# Patient Record
Sex: Female | Born: 2005 | Race: White | Hispanic: No | Marital: Single | Smoking: Never smoker
Health system: Southern US, Community
[De-identification: ages and names within clinical notes are randomized; demographics above are authoritative.]

## PROBLEM LIST (undated history)

## (undated) DIAGNOSIS — F319 Bipolar disorder, unspecified: Secondary | ICD-10-CM

## (undated) DIAGNOSIS — F32A Depression, unspecified: Secondary | ICD-10-CM

## (undated) DIAGNOSIS — F419 Anxiety disorder, unspecified: Secondary | ICD-10-CM

## (undated) HISTORY — PX: APPENDECTOMY: SHX54

## (undated) HISTORY — DX: Anxiety disorder, unspecified: F41.9

## (undated) HISTORY — DX: Depression, unspecified: F32.A

## (undated) HISTORY — DX: Bipolar disorder, unspecified: F31.9

---
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
## 2023-04-09 ENCOUNTER — Other Ambulatory Visit: Payer: Self-pay

## 2023-04-09 NOTE — Telephone Encounter (Signed)
Pt called in stating that she's will be a new pt as of next week Monday, and she was wondering if provider can refill her med for now?? escitalopram (LEXAPRO) 20 MG tablet,??   I told pt, that provider needs to meet her before, just in case it she does not get her refill.

## 2023-04-10 MED ORDER — ESCITALOPRAM OXALATE 20 MG PO TABS: 20.0000 mg | ORAL_TABLET | Freq: Every day | ORAL | 0 refills | Status: DC

## 2023-04-10 NOTE — Telephone Encounter (Signed)
It has been refilled.  

## 2023-04-10 NOTE — Telephone Encounter (Signed)
Charan are you willing to refill for 1 month?  Pt's appointment is 04/15/23.  Prescription is pended. Let me know and I can call the pt.

## 2023-04-14 NOTE — Progress Notes (Deleted)
   New Patient Office Visit  Subjective    Patient ID: Donna Wagner, female    DOB: [birth year]  Age: 17 y.o. MRN: [id number]  CC: No chief complaint on file.   HPI Donna Wagner presents to establish care. Her previous PCP was... She has history of anxiety,  ***  Health Maintenance  Topic Date Due   COVID-19 Vaccine (1) Never done   HPV VACCINES (1 - 2-dose series) Never done   Hepatitis C Screening  Never done   DTaP/Tdap/Td (1 - Tdap) Never done   INFLUENZA VACCINE  05/22/2023   HIV Screening  Completed       Topic Date Due   HPV VACCINES (1 - 2-dose series) Never done    Outpatient Encounter Medications as of 04/15/2023  Medication Sig   escitalopram (LEXAPRO) 20 MG tablet Take 1 tablet (20 mg total) by mouth daily.   HYDROcodone-acetaminophen (NORCO/VICODIN) 5-325 MG tablet Take 1-2 tablets by mouth every 4 (four) hours as needed for moderate pain.   No facility-administered encounter medications on file as of 04/15/2023.    No past medical history on file.  Past Surgical History:  Procedure Laterality Date   XI ROBOTIC LAPAROSCOPIC ASSISTED APPENDECTOMY N/A 11/19/2022   Procedure: XI ROBOTIC LAPAROSCOPIC ASSISTED APPENDECTOMY;  Surgeon: Carolan Shiver, MD;  Location: [facility] ORS;  Service: General;  Laterality: N/A;    No family history on file.  Social History   Socioeconomic History   Marital status: Single    Spouse name: Not on file   Number of children: Not on file   Years of education: Not on file   Highest education level: Not on file  Occupational History   Not on file  Tobacco Use   Smoking status: Never   Smokeless tobacco: Never  Vaping Use   Vaping Use: Never used  Substance and Sexual Activity   Alcohol use: Not Currently   Drug use: Not Currently   Sexual activity: Not Currently  Other Topics Concern   Not on file  Social History Narrative   Not on file   Social Determinants of Health   Financial Resource Strain: Not  on file  Food Insecurity: No Food Insecurity (11/19/2022)   Hunger Vital Sign    Worried About Running Out of Food in the Last Year: Never true    Ran Out of Food in the Last Year: Never true  Transportation Needs: No Transportation Needs (11/19/2022)   PRAPARE - Administrator, Civil Service (Medical): No    Lack of Transportation (Non-Medical): No  Physical Activity: Not on file  Stress: Not on file  Social Connections: Not on file  Intimate Partner Violence: Not At Risk (11/19/2022)   Humiliation, Afraid, Rape, and Kick questionnaire    Fear of Current or Ex-Partner: No    Emotionally Abused: No    Physically Abused: No    Sexually Abused: No    ROS Negative unless indicated in HPI.      Objective    There were no vitals taken for this visit.  Physical Exam  {Labs (Optional):23779}    Assessment & Plan:  There are no diagnoses linked to this encounter.  No follow-ups on file.   Kara Dies, NP

## 2023-04-15 ENCOUNTER — Ambulatory Visit: Payer: 59 | Admitting: Nurse Practitioner

## 2023-05-15 ENCOUNTER — Emergency Department
Admission: EM | Admit: 2023-05-15 | Discharge: 2023-05-15 | Disposition: A | Discharge status: Home / Self Care | Payer: 59 | Attending: Emergency Medicine | Admitting: Emergency Medicine

## 2023-05-15 ENCOUNTER — Encounter: Payer: Self-pay | Admitting: Nurse Practitioner

## 2023-05-15 ENCOUNTER — Ambulatory Visit (INDEPENDENT_AMBULATORY_CARE_PROVIDER_SITE_OTHER): Payer: 59 | Admitting: Nurse Practitioner

## 2023-05-15 ENCOUNTER — Other Ambulatory Visit: Payer: Self-pay

## 2023-05-15 VITALS — BP 118/76 | HR 91 | Temp 98.4°F | Ht 64.0 in | Wt 142.8 lb

## 2023-05-15 DIAGNOSIS — F515 Nightmare disorder: Secondary | ICD-10-CM | POA: Insufficient documentation

## 2023-05-15 DIAGNOSIS — F32A Depression, unspecified: Secondary | ICD-10-CM | POA: Insufficient documentation

## 2023-05-15 DIAGNOSIS — F329 Major depressive disorder, single episode, unspecified: Secondary | ICD-10-CM | POA: Diagnosis not present

## 2023-05-15 DIAGNOSIS — R45851 Suicidal ideations: Secondary | ICD-10-CM | POA: Insufficient documentation

## 2023-05-15 DIAGNOSIS — F419 Anxiety disorder, unspecified: Secondary | ICD-10-CM | POA: Diagnosis not present

## 2023-05-15 DIAGNOSIS — R37 Sexual dysfunction, unspecified: Secondary | ICD-10-CM | POA: Diagnosis not present

## 2023-05-15 DIAGNOSIS — F322 Major depressive disorder, single episode, severe without psychotic features: Secondary | ICD-10-CM | POA: Diagnosis present

## 2023-05-15 DIAGNOSIS — F4310 Post-traumatic stress disorder, unspecified: Secondary | ICD-10-CM

## 2023-05-15 LAB — CBC
HCT: 37.2 % (ref 36.0–46.0)
Hemoglobin: 12.9 g/dL (ref 12.0–15.0)
MCH: 30.1 pg (ref 26.0–34.0)
MCHC: 34.7 g/dL (ref 30.0–36.0)
MCV: 86.9 fL (ref 80.0–100.0)
Platelets: 292 10*3/uL (ref 150–400)
RBC: 4.28 MIL/uL (ref 3.87–5.11)
RDW: 13.2 % (ref 11.5–15.5)
WBC: 7 10*3/uL (ref 4.0–10.5)
nRBC: 0 % (ref 0.0–0.2)

## 2023-05-15 LAB — ETHANOL: Alcohol, Ethyl (B): <10 mg/dL (ref <10)

## 2023-05-15 LAB — URINE DRUG SCREEN, QUALITATIVE (ARMC ONLY)
Amphetamines, Ur Screen: NOT DETECTED
Barbiturates, Ur Screen: NOT DETECTED
Benzodiazepine, Ur Scrn: NOT DETECTED
Cannabinoid 50 Ng, Ur ~~LOC~~: POSITIVE — AB
Cocaine Metabolite,Ur ~~LOC~~: NOT DETECTED
MDMA (Ecstasy)Ur Screen: NOT DETECTED
Methadone Scn, Ur: NOT DETECTED
Opiate, Ur Screen: NOT DETECTED
Phencyclidine (PCP) Ur S: NOT DETECTED
Tricyclic, Ur Screen: NOT DETECTED

## 2023-05-15 LAB — COMPREHENSIVE METABOLIC PANEL
ALT: 20 U/L (ref 0–44)
AST: 28 U/L (ref 15–41)
Albumin: 4.4 g/dL (ref 3.5–5.0)
Alkaline Phosphatase: 89 U/L (ref 38–126)
Anion gap: 11 (ref 5–15)
BUN: 15 mg/dL (ref 6–20)
CO2: 22 mmol/L (ref 22–32)
Calcium: 9.3 mg/dL (ref 8.9–10.3)
Chloride: 102 mmol/L (ref 98–111)
Creatinine, Ser: 0.6 mg/dL (ref 0.44–1.00)
GFR, Estimated: >60 mL/min (ref >60)
Glucose, Bld: 79 mg/dL (ref 70–99)
Potassium: 3.7 mmol/L (ref 3.5–5.1)
Sodium: 135 mmol/L (ref 135–145)
Total Bilirubin: 0.7 mg/dL (ref 0.3–1.2)
Total Protein: 8.6 g/dL — ABNORMAL HIGH (ref 6.5–8.1)

## 2023-05-15 LAB — POC URINE PREG, ED: Preg Test, Ur: NEGATIVE

## 2023-05-15 LAB — ACETAMINOPHEN LEVEL: Acetaminophen (Tylenol), Serum: <10 ug/mL — ABNORMAL LOW (ref 10–30)

## 2023-05-15 LAB — SALICYLATE LEVEL: Salicylate Lvl: <7 mg/dL — ABNORMAL LOW (ref 7.0–30.0)

## 2023-05-15 MED ORDER — HYDROXYZINE PAMOATE 25 MG PO CAPS: 25.0000 mg | ORAL_CAPSULE | Freq: Three times a day (TID) | ORAL | 0 refills | Status: DC | PRN

## 2023-05-15 MED ORDER — IBUPROFEN 600 MG PO TABS: 600.0000 mg | ORAL_TABLET | Freq: Three times a day (TID) | ORAL | Status: DC | PRN

## 2023-05-15 MED ORDER — HYDROXYZINE HCL 25 MG PO TABS: 25.0000 mg | ORAL_TABLET | Freq: Three times a day (TID) | ORAL | 0 refills | Status: AC | PRN

## 2023-05-15 MED ORDER — ESCITALOPRAM OXALATE 20 MG PO TABS: 20.0000 mg | ORAL_TABLET | Freq: Every day | ORAL | 0 refills | Status: DC

## 2023-05-15 MED ORDER — ONDANSETRON HCL 4 MG PO TABS: 4.0000 mg | ORAL_TABLET | Freq: Three times a day (TID) | ORAL | Status: DC | PRN

## 2023-05-15 MED ORDER — ALUM & MAG HYDROXIDE-SIMETH 200-200-20 MG/5ML PO SUSP: 30.0000 mL | Freq: Four times a day (QID) | ORAL | Status: DC | PRN

## 2023-05-15 NOTE — ED Notes (Signed)
VOLUNTARY CONSULT DONE PENDING DISPOSITION

## 2023-05-15 NOTE — Patient Instructions (Addendum)
Please schedule fasting labs. Rx sent to pharmacy. Please let us know the where you would like to referral for mental health via mychart. Referral sent to Gyn.

## 2023-05-15 NOTE — ED Notes (Signed)
Pt has used phone to contact ride and bring her belongings. States that family is on the way
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
## 2023-05-15 NOTE — ED Notes (Signed)
Pt dressed out into hospital scrubs by this RN and Byrd Hesselbach NT.  Belongings sent home with mom Manson Passey bra Gray socks Purple crocs 2 bracelets Black earrings Black pants Black shirt Green jacket Gray underwear
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
## 2023-05-15 NOTE — Hospital Course (Signed)
During this hospital stay Donna Wagner will receive psychosocial and education assessment.  A treatment plan developed to decrease risk of relapse upon discharge and the need for readmission.  Donna Wagner will participate in group therapy.  Psychotherapy:  Psychosocial education regarding relapse prevention and self care; Social and Doctor, hospital; Learning based strategies; Cognitive behavioral; and family object relations individuation separation intervention psychotherapies can be considered.
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
## 2023-05-15 NOTE — ED Notes (Addendum)
Pt was being registered, nurse received a phone call by pts mother via caller ID. This nurse asked pt if nurse could speak and communicate with moter. Pt stated it was okay to do so, nurse answered the phone and caller had hung up when call was answered. Per triage staff pt belongings were not brought. DC paperwork reviewed with pt, questions answered. Pt advised this nurse that her mother may not be happy with plan, nurse offered to speak with her mother in triage at pt approval, pt agreed. Nurse escorted pt to triage where mother was standing. Nurse asked if pt mother had any questions and she replied no and that a complaint would be filed and walked to . Pt turned to nurse and nurse told pt that it was nice to meet her and nurse hopes she has a good night. Pt exited behind mother.  Triage staff had sent update to this nurse while nurse was with pt and update nurse when in triage. Pt mother stated in triage that she was unhappy that there was a teleconsult and that she was not notified of this. States she would not have brought her here if she was aware of this and planned to sue the hospital.
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
## 2023-05-15 NOTE — BH Assessment (Addendum)
TTS/Psych consults complete. Per psych NP Lodema Pilot D., patient does not meet inpatient criteria and is psych cleared. Pt was able to contract for safety and agreed to come back to the ED if symptoms worsen or SI returns.   This Clinical research associate provided pt with outpatient resources.

## 2023-05-15 NOTE — ED Triage Notes (Addendum)
Pt to ED voluntarily with mom for psych eval. Pt not wanting to talk much in triage. Per mom, pt has been having manic episodes and is having a "mental crisis". Earlier today she told her bf she wanted to take some pills that were at her bedside and told her mom that there was "nothing to live for". Pt denies SI but says she feels like she is going to have "violent outbursts" and feels like hurting other people. Pt recently had nightmares from a past event that she thinks has triggered these episodes. Hx of depression and anxiety

## 2023-05-15 NOTE — ED Notes (Signed)
Psych and tts to room for consult/teleconsult

## 2023-05-22 ENCOUNTER — Other Ambulatory Visit: Payer: Managed Care, Other (non HMO)

## 2023-05-22 DIAGNOSIS — F419 Anxiety disorder, unspecified: Secondary | ICD-10-CM | POA: Diagnosis not present

## 2023-05-22 DIAGNOSIS — F32A Depression, unspecified: Secondary | ICD-10-CM

## 2023-05-22 LAB — CBC WITH DIFFERENTIAL/PLATELET
Basophils Absolute: 0.1 10*3/uL (ref 0.0–0.1)
Basophils Relative: 1 % (ref 0.0–3.0)
Eosinophils Absolute: 0.3 10*3/uL (ref 0.0–0.7)
Eosinophils Relative: 4.9 % (ref 0.0–5.0)
HCT: 39.3 % (ref 36.0–49.0)
Hemoglobin: 13 g/dL (ref 12.0–16.0)
Lymphocytes Relative: 45 % (ref 24.0–48.0)
Lymphs Abs: 2.5 10*3/uL (ref 0.7–4.0)
MCHC: 33.1 g/dL (ref 31.0–37.0)
MCV: 90.1 fl (ref 78.0–98.0)
Monocytes Absolute: 0.5 10*3/uL (ref 0.1–1.0)
Monocytes Relative: 9.6 % (ref 3.0–12.0)
Neutro Abs: 2.2 10*3/uL (ref 1.4–7.7)
Neutrophils Relative %: 39.5 % — ABNORMAL LOW (ref 43.0–71.0)
Platelets: 292 10*3/uL (ref 150.0–575.0)
RBC: 4.37 Mil/uL (ref 3.80–5.70)
RDW: 13.7 % (ref 11.4–15.5)
WBC: 5.5 10*3/uL (ref 4.5–13.5)

## 2023-05-22 LAB — LIPID PANEL
Cholesterol: 142 mg/dL (ref 0–200)
HDL: 45.3 mg/dL (ref >39.00)
LDL Cholesterol: 88 mg/dL (ref 0–99)
NonHDL: 96.25
Total CHOL/HDL Ratio: 3
Triglycerides: 43 mg/dL (ref 0.0–149.0)
VLDL: 8.6 mg/dL (ref 0.0–40.0)

## 2023-05-22 LAB — COMPREHENSIVE METABOLIC PANEL
ALT: 12 U/L (ref 0–35)
AST: 17 U/L (ref 0–37)
Albumin: 4.2 g/dL (ref 3.5–5.2)
Alkaline Phosphatase: 87 U/L (ref 47–119)
BUN: 15 mg/dL (ref 6–23)
CO2: 28 mEq/L (ref 19–32)
Calcium: 9.3 mg/dL (ref 8.4–10.5)
Chloride: 103 mEq/L (ref 96–112)
Creatinine, Ser: 0.66 mg/dL (ref 0.40–1.20)
GFR: 127.2 mL/min (ref >60.00)
Glucose, Bld: 79 mg/dL (ref 70–99)
Potassium: 3.9 mEq/L (ref 3.5–5.1)
Sodium: 139 mEq/L (ref 135–145)
Total Bilirubin: 0.5 mg/dL (ref 0.2–1.2)
Total Protein: 7.7 g/dL (ref 6.0–8.3)

## 2023-05-22 LAB — TSH: TSH: 1.9 u[IU]/mL (ref 0.40–5.00)

## 2023-05-27 ENCOUNTER — Encounter: Payer: Self-pay | Admitting: Obstetrics and Gynecology

## 2023-05-28 ENCOUNTER — Other Ambulatory Visit: Payer: Self-pay | Admitting: Nurse Practitioner

## 2023-05-28 DIAGNOSIS — F32A Anxiety disorder, unspecified: Secondary | ICD-10-CM

## 2023-05-29 DIAGNOSIS — R37 Sexual dysfunction, unspecified: Secondary | ICD-10-CM | POA: Insufficient documentation

## 2023-05-29 DIAGNOSIS — F32A Depression, unspecified: Secondary | ICD-10-CM | POA: Insufficient documentation

## 2023-05-29 NOTE — Assessment & Plan Note (Addendum)
PHQ-9 score 14 and GAD -7 score 11. Continue Zoloft and will treat with hydroxyzine as needed for anxiety. Will refer to the therapist, list of therapist was provided to the patient. Fasting labs ordered.

## 2023-05-29 NOTE — Assessment & Plan Note (Signed)
Referral sent to GYN

## 2023-05-30 ENCOUNTER — Telehealth: Payer: Self-pay | Admitting: Nurse Practitioner

## 2023-05-30 ENCOUNTER — Other Ambulatory Visit: Payer: Self-pay

## 2023-05-30 DIAGNOSIS — F32A Depression, unspecified: Secondary | ICD-10-CM

## 2023-05-30 MED ORDER — ESCITALOPRAM OXALATE 20 MG PO TABS
20.0000 mg | ORAL_TABLET | Freq: Every day | ORAL | 0 refills | Status: DC
Start: 2023-05-30 — End: 2023-06-25

## 2023-05-30 NOTE — Telephone Encounter (Signed)
Prescription Request  05/30/2023  LOV: 05/15/2023  What is the name of the medication or equipment? escitalopram (LEXAPRO) 20 MG tablet  Have you contacted your pharmacy to request a refill? Yes   Which pharmacy would you like this sent to?   [id number] [name], [place] - [address] AT [facility] [name] [place] [address] Phone: [phone] Fax: [phone]    Patient notified that their request is being sent to the clinical staff for review and that they should receive a response within 2 business days.   Please advise at Mobile [phone] (mobile)

## 2023-06-23 ENCOUNTER — Other Ambulatory Visit: Payer: Self-pay | Admitting: Nurse Practitioner

## 2023-06-23 DIAGNOSIS — F419 Anxiety disorder, unspecified: Secondary | ICD-10-CM

## 2023-06-23 DIAGNOSIS — F32A Depression, unspecified: Secondary | ICD-10-CM

## 2023-08-03 ENCOUNTER — Emergency Department
Admission: EM | Admit: 2023-08-03 | Discharge: 2023-08-04 | Disposition: A | Discharge status: Other Acute Inpatient Hospital | Payer: 59 | Attending: Emergency Medicine | Admitting: Emergency Medicine

## 2023-08-03 ENCOUNTER — Other Ambulatory Visit: Payer: Self-pay

## 2023-08-03 DIAGNOSIS — F339 Major depressive disorder, recurrent, unspecified: Secondary | ICD-10-CM | POA: Insufficient documentation

## 2023-08-03 DIAGNOSIS — Z79899 Other long term (current) drug therapy: Secondary | ICD-10-CM | POA: Insufficient documentation

## 2023-08-03 DIAGNOSIS — F4310 Post-traumatic stress disorder, unspecified: Secondary | ICD-10-CM | POA: Diagnosis not present

## 2023-08-03 DIAGNOSIS — F29 Unspecified psychosis not due to a substance or known physiological condition: Secondary | ICD-10-CM | POA: Insufficient documentation

## 2023-08-03 DIAGNOSIS — F419 Anxiety disorder, unspecified: Secondary | ICD-10-CM | POA: Diagnosis present

## 2023-08-03 DIAGNOSIS — F32A Depression, unspecified: Secondary | ICD-10-CM | POA: Diagnosis present

## 2023-08-03 DIAGNOSIS — T50901A Poisoning by unspecified drugs, medicaments and biological substances, accidental (unintentional), initial encounter: Secondary | ICD-10-CM | POA: Diagnosis present

## 2023-08-03 DIAGNOSIS — R45851 Suicidal ideations: Secondary | ICD-10-CM | POA: Diagnosis not present

## 2023-08-03 LAB — COMPREHENSIVE METABOLIC PANEL
ALT: 12 U/L (ref 0–44)
AST: 17 U/L (ref 15–41)
Albumin: 4.1 g/dL (ref 3.5–5.0)
Alkaline Phosphatase: 78 U/L (ref 38–126)
Anion gap: 11 (ref 5–15)
BUN: 11 mg/dL (ref 6–20)
CO2: 23 mmol/L (ref 22–32)
Calcium: 9.2 mg/dL (ref 8.9–10.3)
Chloride: 103 mmol/L (ref 98–111)
Creatinine, Ser: 0.71 mg/dL (ref 0.44–1.00)
GFR, Estimated: >60 mL/min (ref >60)
Glucose, Bld: 86 mg/dL (ref 70–99)
Potassium: 3.3 mmol/L — ABNORMAL LOW (ref 3.5–5.1)
Sodium: 137 mmol/L (ref 135–145)
Total Bilirubin: 0.7 mg/dL (ref 0.3–1.2)
Total Protein: 7.5 g/dL (ref 6.5–8.1)

## 2023-08-03 LAB — ACETAMINOPHEN LEVEL: Acetaminophen (Tylenol), Serum: <10 ug/mL — ABNORMAL LOW (ref 10–30)

## 2023-08-03 LAB — CBC
HCT: 37.3 % (ref 36.0–46.0)
Hemoglobin: 12.9 g/dL (ref 12.0–15.0)
MCH: 30.7 pg (ref 26.0–34.0)
MCHC: 34.6 g/dL (ref 30.0–36.0)
MCV: 88.8 fL (ref 80.0–100.0)
Platelets: 272 10*3/uL (ref 150–400)
RBC: 4.2 MIL/uL (ref 3.87–5.11)
RDW: 13.3 % (ref 11.5–15.5)
WBC: 5.8 10*3/uL (ref 4.0–10.5)
nRBC: 0 % (ref 0.0–0.2)

## 2023-08-03 LAB — URINE DRUG SCREEN, QUALITATIVE (ARMC ONLY)
Amphetamines, Ur Screen: NOT DETECTED
Barbiturates, Ur Screen: NOT DETECTED
Benzodiazepine, Ur Scrn: NOT DETECTED
Cannabinoid 50 Ng, Ur ~~LOC~~: POSITIVE — AB
Cocaine Metabolite,Ur ~~LOC~~: NOT DETECTED
MDMA (Ecstasy)Ur Screen: NOT DETECTED
Methadone Scn, Ur: NOT DETECTED
Opiate, Ur Screen: NOT DETECTED
Phencyclidine (PCP) Ur S: NOT DETECTED
Tricyclic, Ur Screen: NOT DETECTED

## 2023-08-03 LAB — ETHANOL: Alcohol, Ethyl (B): <10 mg/dL (ref <10)

## 2023-08-03 LAB — PREGNANCY, URINE: Preg Test, Ur: NEGATIVE

## 2023-08-03 LAB — SALICYLATE LEVEL: Salicylate Lvl: <7 mg/dL — ABNORMAL LOW (ref 7.0–30.0)

## 2023-08-03 MED ORDER — POTASSIUM CHLORIDE CRYS ER 20 MEQ PO TBCR
20.0000 meq | EXTENDED_RELEASE_TABLET | Freq: Once | ORAL | Status: AC
  Administered 2023-08-03: 20 meq via ORAL
  Filled 2023-08-03: qty 1

## 2023-08-03 MED ORDER — ARIPIPRAZOLE 10 MG PO TABS
5.0000 mg | ORAL_TABLET | Freq: Every day | ORAL | Status: DC
  Administered 2023-08-03: 5 mg via ORAL
  Filled 2023-08-03: qty 1

## 2023-08-03 MED ORDER — MELATONIN 5 MG PO TABS: 5.0000 mg | ORAL_TABLET | Freq: Every day | ORAL | Status: DC

## 2023-08-03 MED ORDER — ESCITALOPRAM OXALATE 10 MG PO TABS: 20.0000 mg | ORAL_TABLET | Freq: Every day | ORAL | Status: DC

## 2023-08-03 MED ORDER — HYDROXYZINE HCL 25 MG PO TABS: 25.0000 mg | ORAL_TABLET | Freq: Three times a day (TID) | ORAL | Status: DC | PRN

## 2023-08-03 NOTE — ED Provider Notes (Signed)
[facility] Provider Note    Event Date/Time   First MD Initiated Contact with Patient 08/03/23 1459     (approximate)   History   Chief Complaint Mental Health Problem   HPI  Donna A Steinfeldt is a 17 y.o. female with past medical history of depression and PTSD who presents to the ED for mental health evaluation.  Patient reports that she has been having outbursts of anxiety and anger, where she wants to break things and hurt herself.  Boyfriend stated in triage that she will become very violent at times and nobody can talk her down.  Patient was initially screaming and kicking on arrival to the ED, but is now calm and answering questions appropriately.  She does report thoughts of harming herself, states she wants to punch through glass.  She states she has been taking her medications as prescribed, denies any substance abuse.     Physical Exam   Triage Vital Signs: ED Triage Vitals  Encounter Vitals Group     BP 08/03/23 1400 111/75     Systolic BP Percentile --      Diastolic BP Percentile --      Pulse Rate 08/03/23 1400 71     Resp 08/03/23 1400 16     Temp 08/03/23 1400 98 F (36.7 C)     Temp src --      SpO2 08/03/23 1400 95 %     Weight 08/03/23 1405 120 lb (54.4 kg)     Height 08/03/23 1405 5\' 4"  (1.626 m)     Head Circumference --      Peak Flow --      Pain Score --      Pain Loc --      Pain Education --      Exclude from Growth Chart --     Most recent vital signs: Vitals:   08/03/23 1400  BP: 111/75  Pulse: 71  Resp: 16  Temp: 98 F (36.7 C)  SpO2: 95%    Constitutional: Alert and oriented. Eyes: Conjunctivae are normal. Head: Atraumatic. Nose: No congestion/rhinnorhea. Mouth/Throat: Mucous membranes are moist.  Cardiovascular: Normal rate, regular rhythm. Grossly normal heart sounds.  2+ radial pulses bilaterally. Respiratory: Normal respiratory effort.  No retractions. Lungs CTAB. Gastrointestinal: Soft and  nontender. No distention. Musculoskeletal: No lower extremity tenderness nor edema.  Neurologic:  Normal speech and language. No gross focal neurologic deficits are appreciated.    ED Results / Procedures / Treatments   Labs (all labs ordered are listed, but only abnormal results are displayed) Labs Reviewed  COMPREHENSIVE METABOLIC PANEL - Abnormal; Notable for the following components:      Result Value   Potassium 3.3 (*)    All other components within normal limits  SALICYLATE LEVEL - Abnormal; Notable for the following components:   Salicylate Lvl <7.0 (*)    All other components within normal limits  ACETAMINOPHEN LEVEL - Abnormal; Notable for the following components:   Acetaminophen (Tylenol), Serum <10 (*)    All other components within normal limits  URINE DRUG SCREEN, QUALITATIVE ([facility] ONLY) - Abnormal; Notable for the following components:   Cannabinoid 50 Ng, Ur [place] POSITIVE (*)    All other components within normal limits  ETHANOL  CBC  PREGNANCY, URINE  POC URINE PREG, ED    PROCEDURES:  Critical Care performed: No  Procedures   MEDICATIONS ORDERED IN ED: Medications  potassium chloride SA (KLOR-CON M) CR tablet 20  mEq (20 mEq Oral Given 08/03/23 1537)     IMPRESSION / MDM / ASSESSMENT AND PLAN / ED COURSE  I reviewed the triage vital signs and the nursing notes.                              17 y.o. female with past medical history of depression and PTSD who presents to the ED for outburst of wanting to break things and harm herself.  Patient's presentation is most consistent with acute presentation with potential threat to life or bodily function.  Differential diagnosis includes, but is not limited to, psychosis, anxiety, depression, mania, substance abuse, medication noncompliance.  Patient nontoxic-appearing and in no acute distress, vital signs are unremarkable.  Patient denies any medical complaints and labs are reassuring with no significant  EMEA, leukocytosis, electrolyte abnormality, or AKI.  LFTs are unremarkable, Tylenol and salicylate levels are undetectable.  Patient may be medically cleared for psychiatric disposition, has been placed under IVC due to erratic behavior with suicidal ideation.  Psychiatric evaluation is pending at this time.  The patient has been placed in psychiatric observation due to the need to provide a safe environment for the patient while obtaining psychiatric consultation and evaluation, as well as ongoing medical and medication management to treat the patient's condition.  The patient has been placed under full IVC at this time.      FINAL CLINICAL IMPRESSION(S) / ED DIAGNOSES   Final diagnoses:  Suicidal ideation     Rx / DC Orders   ED Discharge Orders     None        Note:  This document was prepared using Dragon voice recognition software and may include unintentional dictation errors.   Chesley Noon, MD 08/03/23 [id number]

## 2023-08-03 NOTE — ED Notes (Signed)
PATIENT BED AVAILABLE AFTER 9AM ON 08/04/23 Patient has been accepted to [facility]  Patient assigned to room 402 bed 2 Accepting physician is Dr. Sherron Flemings.  Call report to [phone].  Representative was [facility] [place].

## 2023-08-03 NOTE — BH Assessment (Signed)
PATIENT BED AVAILABLE AFTER 9AM ON 08/04/23  Patient has been accepted to [facility]  Patient assigned to room 402 bed 2 Accepting physician is Dr. Sherron Flemings.  Call report to [phone].  Representative was [facility] [place].   ER Staff is aware of it:  [facility] ER Secretary  Dr. Larinda Buttery, ER MD  Feliz Beam Patient's Nurse
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
## 2023-08-03 NOTE — ED Triage Notes (Signed)
Pt sts that she has been having anxiety and outburst for months. Pt boyfriend sts that pt does not want to get help but pt become very violent and no one can talk her down. Pt is screaming in triage while kicking and moving arms.
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
## 2023-08-03 NOTE — BH Assessment (Signed)
[facility] AC Tresa Endo S.) updated about the patient's disposition for inpatient treatment.

## 2023-08-03 NOTE — ED Notes (Signed)
ivc/consult done/recommend psychiatric inpatient admission when medically cleared.Marland Kitchen

## 2023-08-03 NOTE — ED Notes (Addendum)
Pt belongings:  Lilac crocs Black shorts Peach sweater Pink underwear Brown bra Hair scunchie  Boyfriend to take belongings with him.

## 2023-08-03 NOTE — ED Triage Notes (Signed)
First Nurse Note;  Pt mother, father, and sister at first nurse desk wanting to see patient. Explained that pt did not want these family members with her and wanted her boyfriend with her. Pt's parent very concerned about her being discharged, explained that we would prioritize pt to come back to a room to see a doctor. Parents very concerned that she will kill herself and that she has be misdiagnosed with depression instead of bipolar disorder.

## 2023-08-04 ENCOUNTER — Inpatient Hospital Stay (HOSPITAL_COMMUNITY)
Admission: AD | Admit: 2023-08-04 | Discharge: 2023-08-11 | DRG: 885 | Disposition: A | Discharge status: Home / Self Care | Payer: 59 | Source: Intra-hospital | Attending: Psychiatry | Admitting: Psychiatry

## 2023-08-04 ENCOUNTER — Encounter (HOSPITAL_COMMUNITY): Payer: Self-pay | Admitting: Psychiatry

## 2023-08-04 DIAGNOSIS — R45851 Suicidal ideations: Secondary | ICD-10-CM | POA: Diagnosis present

## 2023-08-04 DIAGNOSIS — Z8249 Family history of ischemic heart disease and other diseases of the circulatory system: Secondary | ICD-10-CM | POA: Diagnosis not present

## 2023-08-04 DIAGNOSIS — F909 Attention-deficit hyperactivity disorder, unspecified type: Secondary | ICD-10-CM | POA: Diagnosis present

## 2023-08-04 DIAGNOSIS — F3181 Bipolar II disorder: Secondary | ICD-10-CM | POA: Diagnosis present

## 2023-08-04 DIAGNOSIS — Z79899 Other long term (current) drug therapy: Secondary | ICD-10-CM | POA: Diagnosis not present

## 2023-08-04 DIAGNOSIS — Z818 Family history of other mental and behavioral disorders: Secondary | ICD-10-CM | POA: Diagnosis not present

## 2023-08-04 DIAGNOSIS — F332 Major depressive disorder, recurrent severe without psychotic features: Secondary | ICD-10-CM | POA: Diagnosis not present

## 2023-08-04 DIAGNOSIS — Z888 Allergy status to other drugs, medicaments and biological substances status: Secondary | ICD-10-CM

## 2023-08-04 DIAGNOSIS — Z809 Family history of malignant neoplasm, unspecified: Secondary | ICD-10-CM | POA: Diagnosis not present

## 2023-08-04 DIAGNOSIS — F4310 Post-traumatic stress disorder, unspecified: Secondary | ICD-10-CM | POA: Diagnosis present

## 2023-08-04 DIAGNOSIS — F17290 Nicotine dependence, other tobacco product, uncomplicated: Secondary | ICD-10-CM | POA: Diagnosis present

## 2023-08-04 MED ORDER — DIVALPROEX SODIUM ER 250 MG PO TB24
250.0000 mg | ORAL_TABLET | Freq: Every day | ORAL | Status: DC
  Administered 2023-08-04: 250 mg via ORAL
  Filled 2023-08-04 (×2): qty 1

## 2023-08-04 MED ORDER — MELATONIN 5 MG PO TABS
5.0000 mg | ORAL_TABLET | Freq: Every evening | ORAL | Status: DC | PRN
  Administered 2023-08-04 – 2023-08-10 (×7): 5 mg via ORAL
  Filled 2023-08-04 (×6): qty 1

## 2023-08-04 MED ORDER — ALUM & MAG HYDROXIDE-SIMETH 200-200-20 MG/5ML PO SUSP: 30.0000 mL | ORAL | Status: DC | PRN

## 2023-08-04 MED ORDER — LORAZEPAM 2 MG/ML IJ SOLN: 2.0000 mg | Freq: Three times a day (TID) | INTRAMUSCULAR | Status: DC | PRN

## 2023-08-04 MED ORDER — LORAZEPAM 1 MG PO TABS: 2.0000 mg | ORAL_TABLET | Freq: Three times a day (TID) | ORAL | Status: DC | PRN

## 2023-08-04 MED ORDER — DIPHENHYDRAMINE HCL 50 MG/ML IJ SOLN: 50.0000 mg | Freq: Three times a day (TID) | INTRAMUSCULAR | Status: DC | PRN

## 2023-08-04 MED ORDER — HALOPERIDOL LACTATE 5 MG/ML IJ SOLN: 5.0000 mg | Freq: Three times a day (TID) | INTRAMUSCULAR | Status: DC | PRN

## 2023-08-04 MED ORDER — HALOPERIDOL 5 MG PO TABS: 5.0000 mg | ORAL_TABLET | Freq: Three times a day (TID) | ORAL | Status: DC | PRN

## 2023-08-04 MED ORDER — DIPHENHYDRAMINE HCL 25 MG PO CAPS: 50.0000 mg | ORAL_CAPSULE | Freq: Three times a day (TID) | ORAL | Status: DC | PRN

## 2023-08-04 MED ORDER — ACETAMINOPHEN 325 MG PO TABS: 650.0000 mg | ORAL_TABLET | Freq: Four times a day (QID) | ORAL | Status: DC | PRN

## 2023-08-04 MED ORDER — ARIPIPRAZOLE 5 MG PO TABS
5.0000 mg | ORAL_TABLET | Freq: Every day | ORAL | Status: DC
  Administered 2023-08-04 – 2023-08-11 (×8): 5 mg via ORAL
  Filled 2023-08-04 (×12): qty 1

## 2023-08-04 MED ORDER — HYDROXYZINE HCL 25 MG PO TABS
25.0000 mg | ORAL_TABLET | Freq: Three times a day (TID) | ORAL | Status: DC | PRN
  Administered 2023-08-06 – 2023-08-10 (×3): 25 mg via ORAL
  Filled 2023-08-04 (×5): qty 1

## 2023-08-04 MED ORDER — MELATONIN 5 MG PO TABS
5.0000 mg | ORAL_TABLET | Freq: Every day | ORAL | Status: DC
  Filled 2023-08-04 (×2): qty 1

## 2023-08-04 MED ORDER — ESCITALOPRAM OXALATE 20 MG PO TABS
20.0000 mg | ORAL_TABLET | Freq: Every day | ORAL | Status: DC
  Administered 2023-08-04 – 2023-08-11 (×8): 20 mg via ORAL
  Filled 2023-08-04: qty 1
  Filled 2023-08-04: qty 2
  Filled 2023-08-04 (×9): qty 1
  Filled 2023-08-04: qty 2

## 2023-08-04 MED ORDER — MAGNESIUM HYDROXIDE 400 MG/5ML PO SUSP: 30.0000 mL | Freq: Every day | ORAL | Status: DC | PRN

## 2023-08-04 NOTE — BHH Suicide Risk Assessment (Signed)
[facility] Admission Suicide Risk Assessment   Nursing information obtained from:    Demographic factors:  Low socioeconomic status, Caucasian, Adolescent or young adult Current Mental Status:  Self-harm behaviors Loss Factors:  NA Historical Factors:  Prior suicide attempts, Impulsivity Risk Reduction Factors:  Living with another person, especially a relative  Total Time spent with patient: 30 minutes Principal Problem: MDD (major depressive disorder), recurrent severe, without psychosis (HCC) Diagnosis:  Principal Problem:   MDD (major depressive disorder), recurrent severe, without psychosis (HCC)  Subjective Data: The patient is a 17 year old Caucasian female who was admitted with symptoms of violent outbursts and self-mutilation.  Continued Clinical Symptoms:  Alcohol Use Disorder Identification Test Final Score (AUDIT): 0 The "Alcohol Use Disorders Identification Test", Guidelines for Use in Primary Care, Second Edition.  World Science writer [facility]). Score between 0-7:  no or low risk or alcohol related problems. Score between 8-15:  moderate risk of alcohol related problems. Score between 16-19:  high risk of alcohol related problems. Score 20 or above:  warrants further diagnostic evaluation for alcohol dependence and treatment.   CLINICAL FACTORS:   Severe Anxiety and/or Agitation Depression:   Impulsivity Alcohol/Substance Abuse/Dependencies Personality Disorders:   Cluster B More than one psychiatric diagnosis   Musculoskeletal: Strength & Muscle Tone: within normal limits Gait & Station: normal Patient leans: N/A  Psychiatric Specialty Exam:  Presentation  General Appearance:  Fairly Groomed  Eye Contact: Good  Speech: Clear and Coherent; Normal Rate  Speech Volume: Normal  Handedness: Right   Mood and Affect  Mood: Anxious; Depressed  Affect: Blunt; Congruent   Thought Process  Thought Processes: Goal Directed; Linear  Descriptions of  Associations:Intact  Orientation:Full (Time, Place and Person)  Thought Content:Illogical  History of Schizophrenia/Schizoaffective disorder:No  Duration of Psychotic Symptoms:No data recorded Hallucinations:Hallucinations: None  Ideas of Reference:None  Suicidal Thoughts:Suicidal Thoughts: Yes, Passive (pt minimizes)  Homicidal Thoughts:Homicidal Thoughts: No   Sensorium  Memory: Immediate Good; Recent Good; Remote Good  Judgment: Poor  Insight: Lacking   Executive Functions  Concentration: Fair  Attention Span: Fair  Recall: Good  Fund of Knowledge: Fair  Language: Good   Psychomotor Activity  Psychomotor Activity: Psychomotor Activity: Increased (pt observed fidgeting in the chair.)   Assets  Assets: Communication Skills; Financial Resources/Insurance; Housing; Social Support   Sleep  Sleep: Sleep: Fair Number of Hours of Sleep: 6    Physical Exam: Physical Exam Constitutional:      Appearance: Normal appearance. She is normal weight.  HENT:     Head: Normocephalic.  Neurological:     General: No focal deficit present.     Mental Status: She is alert and oriented to person, place, and time. Mental status is at baseline.    Review of Systems  Psychiatric/Behavioral:  Negative for substance abuse and suicidal ideas. The patient is not nervous/anxious.   All other systems reviewed and are negative.  Blood pressure 118/82, pulse 76, temperature 98.4 F (36.9 C), temperature source Oral, resp. rate 18, height 5\' 4"  (1.626 m), weight 54.6 kg, last menstrual period 08/03/2023, SpO2 100%. Body mass index is 20.67 kg/m.   COGNITIVE FEATURES THAT CONTRIBUTE TO RISK:  Thought constriction (tunnel vision)    SUICIDE RISK:   Moderate:  Frequent suicidal ideation with limited intensity, and duration, some specificity in terms of plans, no associated intent, good self-control, limited dysphoria/symptomatology, some risk factors present, and  identifiable protective factors, including available and accessible social support.  PLAN OF CARE: The patient is  admitted to the adult inpatient unit for stabilization in a safe and secure environment.  She will undergo diagnostic clarification and will most likely be started on medications that include a mood stabilizer.  I certify that inpatient services furnished can reasonably be expected to improve the patient's condition.   Rex Kras, MD 08/04/2023, 12:23 PM

## 2023-08-04 NOTE — ED Provider Notes (Signed)
Emergency Medicine Observation Re-evaluation Note  Donna Wagner is a 17 y.o. female, seen on rounds today.  Pt initially presented to the ED for complaints of Mental Health Problem  Currently, the patient is is no acute distress. Denies any concerns at this time.  Physical Exam  Blood pressure 97/60, pulse 62, temperature 98.2 F (36.8 C), temperature source Oral, resp. rate 17, height 5\' 4"  (1.626 m), weight 54.4 kg, last menstrual period 08/03/2023, SpO2 100%.  Physical Exam: General: No apparent distress Pulm: Normal WOB Neuro: Moving all extremities Psych: Resting comfortably     ED Course / MDM     I have reviewed the labs performed to date as well as medications administered while in observation.  Recent changes in the last 24 hours include: No acute events overnight.  Plan   Current plan: Patient awaiting psychiatric disposition. Patient is under full IVC at this time.    Bernhard Koskinen, Layla Maw, DO 08/04/23 [phone]
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
## 2023-08-04 NOTE — Group Note (Signed)
Date:  08/04/2023 Time:  11:55 AM  Group Topic/Focus:  Goals Group:   The focus of this group is to help patients establish daily goals to achieve during treatment and discuss how the patient can incorporate goal setting into their daily lives to aide in recovery.    Participation Level:  Did Not Attend  Participation Quality:      Affect:      Cognitive:      Insight: None  Engagement in Group:      Modes of Intervention:      Additional Comments:    Beckie Busing 08/04/2023, 11:55 AM

## 2023-08-04 NOTE — Progress Notes (Signed)
   08/04/23 2345  Psych Admission Type (Psych Patients Only)  Admission Status Involuntary  Psychosocial Assessment  Patient Complaints Anxiety  Eye Contact Fair  Facial Expression Anxious  Affect Depressed  Speech Soft  Interaction Assertive  Motor Activity Slow  Appearance/Hygiene Improved  Behavior Characteristics Cooperative  Mood Depressed  Aggressive Behavior  Effect No apparent injury  Thought Process  Coherency WDL  Content WDL  Delusions WDL  Perception WDL  Hallucination None reported or observed  Judgment WDL  Confusion WDL  Danger to Self  Current suicidal ideation? Denies

## 2023-08-04 NOTE — ED Notes (Signed)
Attempted to call report to [facility], asked to call back in 20 minutes.

## 2023-08-04 NOTE — ED Notes (Signed)
Mother requested update on pt, pt gave permission to update mother. Mother to drop off pt glasses.

## 2023-08-04 NOTE — ED Provider Notes (Signed)
-----------------------------------------   10:12 AM on 08/04/2023 ----------------------------------------- Patient has been accepted to [facility] behavioral health unit for further treatment.   Minna Antis, MD 08/04/23 1012
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
## 2023-08-04 NOTE — Plan of Care (Signed)
Problem: Education: Goal: Emotional status will improve Outcome: Progressing Goal: Mental status will improve Outcome: Progressing   Problem: Activity: Goal: Interest or engagement in activities will improve Outcome: Progressing Goal: Sleeping patterns will improve Outcome: Progressing   Problem: Safety: Goal: Periods of time without injury will increase Outcome: Progressing

## 2023-08-04 NOTE — Group Note (Signed)
Date:  08/04/2023 Time:  12:01 PM  Group Topic/Focus:  Dimensions of Wellness:   The focus of this group is to introduce the topic of wellness and discuss the role each dimension of wellness plays in total health.    Participation Level:  Did Not Attend  Participation Quality:      Affect:      Cognitive:      Insight: None  Engagement in Group:      Modes of Intervention:      Additional Comments:    Beckie Busing 08/04/2023, 12:01 PM

## 2023-08-04 NOTE — ED Notes (Signed)
[place]  called  for  transport  to [facility] 

## 2023-08-04 NOTE — [facility] Group Notes (Signed)
[facility] Group Notes:  (Nursing/MHT/Case Management/Adjunct)  Date:  08/04/2023  Time:  9:52 PM  Type of Therapy:  Group Therapy  Participation Level:  Minimal  Participation Quality:  Attentive  Affect:  Appropriate  Cognitive:  Appropriate  Insight:  Limited  Engagement in Group:  Limited  Modes of Intervention:  Education  Summary of Progress/Problems: Patient attended the evening A.A. meeting and was appropriate.   Hazle Coca S 08/04/2023, 9:52 PM

## 2023-08-04 NOTE — ED Notes (Signed)
Belonging bag 1/1 sent with pt.

## 2023-08-04 NOTE — ED Notes (Signed)
Pt given breakfast tray

## 2023-08-05 DIAGNOSIS — F332 Major depressive disorder, recurrent severe without psychotic features: Secondary | ICD-10-CM | POA: Diagnosis not present

## 2023-08-05 MED ORDER — DIVALPROEX SODIUM ER 500 MG PO TB24
500.0000 mg | ORAL_TABLET | Freq: Every day | ORAL | Status: DC
  Administered 2023-08-05 – 2023-08-10 (×6): 500 mg via ORAL
  Filled 2023-08-05 (×8): qty 1

## 2023-08-05 NOTE — Group Note (Signed)
Date:  08/05/2023 Time:  9:20 AM  Group Topic/Focus:  Goals Group:   The focus of this group is to help patients establish daily goals to achieve during treatment and discuss how the patient can incorporate goal setting into their daily lives to aide in recovery.    Participation Level:  Active  Participation Quality:  Appropriate  Affect:  Appropriate  Cognitive:  Appropriate  Insight: Appropriate  Engagement in Group:  Engaged  Modes of Intervention:  Discussion  Additional Comments:    Donna Wagner 08/05/2023, 9:20 AM

## 2023-08-05 NOTE — [facility] Group Notes (Addendum)

## 2023-08-05 NOTE — [facility] Group Notes (Signed)
Adult Psychoeducational Group Note  Date:  08/05/2023 Time:  1:56 PM  Group Topic/Focus:  Crisis Planning:   The purpose of this group is to help patients create a crisis plan for use upon discharge or in the future, as needed.  Participation Level:  Active  Participation Quality:  Attentive  Affect:  Appropriate  Cognitive:  Appropriate  Insight: Appropriate  Engagement in Group:  Engaged  Modes of Intervention:  Discussion  Additional Comments:  Patient attended the [facility] group.  Jearl Klinefelter 08/05/2023, 1:56 PM

## 2023-08-05 NOTE — Plan of Care (Signed)

## 2023-08-05 NOTE — [facility] Group Notes (Signed)
[facility] Group Notes:  (Nursing/MHT/Case Management/Adjunct)  Date:  08/05/2023  Time:  9:41 PM  Type of Therapy:   Wrap-up group  Participation Level:  Active  Participation Quality:  Appropriate  Affect:  Appropriate  Cognitive:  Appropriate  Insight:  Appropriate  Engagement in Group:  Engaged  Modes of Intervention:  Education  Summary of Progress/Problems: Pt goal to feel her emotions, not sleep.Rated her day 6/10.  Noah Delaine 08/05/2023, 9:41 PM

## 2023-08-05 NOTE — Group Note (Signed)
Recreation Therapy Group Note   Group Topic:Animal Assisted Therapy   Group Date: 08/05/2023 Start Time: 0954 End Time: 1030 Facilitators: Dylan Monforte-McCall, LRT,CTRS Location: [address] Dayroom   Animal-Assisted Activity (AAA) Program Checklist/Progress Notes Patient Eligibility Criteria Checklist & Daily Group note for Rec Tx Intervention  AAA/T Program Assumption of Risk Form signed by Patient/ or Parent Legal Guardian Yes  Patient is free of allergies or severe asthma Yes  Patient reports no fear of animals Yes  Patient reports no history of cruelty to animals Yes  Patient understands his/her participation is voluntary Yes  Patient washes hands before animal contact Yes  Patient washes hands after animal contact Yes  Education: Hand Washing, Appropriate Animal Interaction   Education Outcome: Acknowledges education.    Affect/Mood: Appropriate   Participation Level: Engaged   Participation Quality: Independent   Behavior: Appropriate   Speech/Thought Process: Focused   Insight: Good   Judgement: Good   Modes of Intervention: Teaching laboratory technician   Patient Response to Interventions:  Engaged   Education Outcome:  In group clarification offered    Clinical Observations/Individualized Feedback: Patient attended session and interacted appropriately with therapy dog and peers. Patient asked appropriate questions about therapy dog and his training. Patient shared stories about their pets at home with group.    Plan: Continue to engage patient in RT group sessions 2-3x/week.   Nare Gaspari-McCall, LRT,CTRS 08/05/2023 12:55 PM

## 2023-08-05 NOTE — Progress Notes (Signed)
   08/05/23 1100  Psych Admission Type (Psych Patients Only)  Admission Status Involuntary  Psychosocial Assessment  Patient Complaints Anxiety  Eye Contact Fair  Facial Expression Animated;Anxious  Affect Depressed  Speech Logical/coherent  Interaction Assertive  Motor Activity Fidgety  Appearance/Hygiene Unremarkable  Behavior Characteristics Cooperative  Mood Depressed;Anxious  Thought Process  Coherency WDL  Content WDL  Delusions WDL  Perception WDL  Hallucination None reported or observed  Judgment WDL  Confusion WDL  Danger to Self  Current suicidal ideation? Denies  Danger to Others  Danger to Others None reported or observed
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
## 2023-08-05 NOTE — Group Note (Signed)
LCSW Group Therapy Note   Group Date: 08/05/2023 Start Time: 1100 End Time: 1200  LCSW Group Therapy Note               Type of Therapy and Topic:  Group Therapy: Establishing Boundaries  Participation Level:  Minimal  In this group, patients learned how to define boundaries, discussed the different types or boundaries with examples.  They identified times that boundaries had been violated and how they reacted.  They analyzed how their reaction was possibly beneficial and how it was possibly unhelpful.  The group discussed how to set boundaries, respect others boundaries and communicate their boundaries. The group utilized a role play scenarios (working with a partner) and discussed how each person in the scenario could have reacted differently and what boundaries they need to implement to improve their life. Patients also discussed consequences to overstepping boundaries and lack of boundaries. Patients discussed how to establish boundaries with clear consequences. Patients will explore discussion questions that address media influence and why it is hard to set boundaries.   Therapeutic Goals: Patients will define boundaries and explore (physical, personal space and language boundaries). Patients will remember their last incident where their boundaries were violated and how they behaved. Patients will practice empathy and understanding of other's boundaries and learn from others in group. Patients will explore how they may have crossed another person's boundaries in the past.  Patients will learn healthy ways to set and communicate boundaries. Patients will actively engage in group activity utilizing role play and critical thinking skills.  Summary of Patient Progress:  Patient was coming in and out of group and did not participate much.    Therapeutic Modalities:   Cognitive Behavioral Therapy  Donna Kings Grant, LCSW  08/05/2023 1:37 PM
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
## 2023-08-06 ENCOUNTER — Encounter (HOSPITAL_COMMUNITY): Payer: Self-pay

## 2023-08-06 DIAGNOSIS — F332 Major depressive disorder, recurrent severe without psychotic features: Secondary | ICD-10-CM | POA: Diagnosis not present

## 2023-08-06 MED ORDER — HYDROXYZINE HCL 25 MG PO TABS
25.0000 mg | ORAL_TABLET | ORAL | Status: AC
  Administered 2023-08-06: 25 mg via ORAL
  Filled 2023-08-06: qty 1

## 2023-08-06 MED ORDER — WHITE PETROLATUM EX OINT
TOPICAL_OINTMENT | CUTANEOUS | Status: AC
  Filled 2023-08-06: qty 5

## 2023-08-06 NOTE — [facility] Group Notes (Signed)
[facility] Group Notes:  (Nursing/MHT/Case Management/Adjunct)   Adult Psychoeducational Group Note  Date:  08/06/2023 Time:  12:22 PM  Group Topic/Focus:  Emotional Education:   The focus of this group is to discuss what feelings/emotions are, and how they are experienced. Goals Group:   The focus of this group is to help patients establish daily goals to achieve during treatment and discuss how the patient can incorporate goal setting into their daily lives to aide in recovery. Orientation:   The focus of this group is to educate the patient on the purpose and policies of crisis stabilization and provide a format to answer questions about their admission.  The group details unit policies and expectations of patients while admitted.  Participation Level:  Active  Participation Quality:  Appropriate  Affect:  Appropriate  Cognitive:  Appropriate  Insight: Good  Engagement in Group:  Engaged  Modes of Intervention:  Discussion, Education, and Orientation  Additional Comments:    Lucilla Edin 08/06/2023, 12:22 PM

## 2023-08-06 NOTE — Progress Notes (Signed)
   08/06/23 2045  Psych Admission Type (Psych Patients Only)  Admission Status Involuntary  Psychosocial Assessment  Patient Complaints Anxiety  Eye Contact Fair  Facial Expression Anxious  Affect Depressed  Speech Soft  Interaction Assertive  Motor Activity Slow  Appearance/Hygiene Improved  Behavior Characteristics Cooperative  Mood Anxious  Aggressive Behavior  Effect No apparent injury  Thought Process  Coherency WDL  Content WDL  Delusions WDL  Perception WDL  Hallucination None reported or observed  Judgment WDL  Confusion WDL  Danger to Self  Current suicidal ideation? Denies

## 2023-08-06 NOTE — Progress Notes (Signed)
   08/06/23 0015  Psych Admission Type (Psych Patients Only)  Admission Status Involuntary  Psychosocial Assessment  Patient Complaints Anxiety  Eye Contact Fair  Facial Expression Anxious  Affect Depressed  Speech Soft  Interaction Assertive  Motor Activity Slow  Appearance/Hygiene Improved  Behavior Characteristics Cooperative  Mood Anxious  Aggressive Behavior  Effect No apparent injury  Thought Process  Coherency WDL  Content WDL  Delusions WDL  Perception WDL  Hallucination None reported or observed  Judgment WDL  Confusion WDL  Danger to Self  Current suicidal ideation? Denies

## 2023-08-06 NOTE — Progress Notes (Signed)
   08/06/23 0000  Psych Admission Type (Psych Patients Only)  Admission Status Involuntary  Psychosocial Assessment  Patient Complaints Anxiety  Eye Contact Fair  Facial Expression Anxious  Affect Depressed  Speech Soft  Interaction Assertive  Motor Activity Slow  Appearance/Hygiene Improved  Behavior Characteristics Cooperative  Mood Anxious  Aggressive Behavior  Effect No apparent injury  Thought Process  Coherency WDL  Content WDL  Delusions WDL  Perception WDL  Hallucination None reported or observed  Judgment WDL  Confusion WDL  Danger to Self  Current suicidal ideation? Denies

## 2023-08-06 NOTE — Group Note (Signed)
Recreation Therapy Group Note   Group Topic:Problem Solving  Group Date: 08/06/2023 Start Time: 0935 End Time: 1015 Facilitators: Lemya Greenwell-McCall, LRT,CTRS Location: [address] Day[address]   Group Topic: Communication, Team Building, Problem Solving  Goal Area(s) Addresses:  Patient will effectively work with peer towards shared goal.  Patient will identify skills used to make activity successful.  Patient will share challenges and verbalize solution-driven approaches used. Patient will identify how skills used during activity can be used to reach post d/c goals.   Intervention: STEM Activity   Group Description: [employer]. Patients were provided the following materials: 4 drinking straws, 5 rubber bands, 5 paper clips, 2 index cards, 2 drinking cups, and 2 toilet paper rolls. Using the provided materials patients were asked to build a launching mechanism to launch a ping pong ball across the room, approximately 10 feet. Patients were divided into teams of 3-5. Instructions required all materials be incorporated into the device, functionality of items left to the peer group's discretion.  Education: Pharmacist, community, Scientist, physiological, Air cabin crew, Building control surveyor.   Education Outcome: Acknowledges education/In group clarification offered/Needs additional education.    Affect/Mood: Appropriate   Participation Level: Engaged   Participation Quality: Independent   Behavior: Appropriate   Speech/Thought Process: Focused   Insight: Good   Judgement: Good   Modes of Intervention: STEM Activity   Patient Response to Interventions:  Engaged   Education Outcome:  In group clarification offered    Clinical Observations/Individualized Feedback: Pt came to group a little late. Pt was placed with one of the groups and went right to work in helping them construct their launcher. Pt was bright, engaged and focused. Pt was social with the other groups as well.     Plan:  Continue to engage patient in RT group sessions 2-3x/week.   Raneem Mendolia-McCall, LRT,CTRS 08/06/2023 12:21 PM

## 2023-08-06 NOTE — Progress Notes (Signed)
Patient rated her depression level 1/10 and her anxiety level 5/10 with 10 being the highest and 0 none. Pt identified her goal for today as, " Attend all the groups today stay social". Medication and group compliant. Vistaril 25 mg po administered for complaints of anxiety with effective results. Pt  observed interacting well with peers. Pt denied SI/HI and AVH. Appetite good on shift. Safety maintained.  08/06/23 0810  Psych Admission Type (Psych Patients Only)  Admission Status Involuntary  Psychosocial Assessment  Patient Complaints Anxiety  Eye Contact Fair  Facial Expression Anxious  Affect Anxious  Speech Logical/coherent  Interaction Assertive  Motor Activity Fidgety  Appearance/Hygiene Unremarkable  Behavior Characteristics Cooperative  Mood Anxious  Aggressive Behavior  Effect No apparent injury  Thought Process  Coherency WDL  Content WDL  Delusions None reported or observed  Perception WDL  Hallucination None reported or observed  Judgment WDL  Confusion WDL  Danger to Self  Current suicidal ideation? Denies  Danger to Others  Danger to Others None reported or observed

## 2023-08-06 NOTE — [facility] Group Notes (Signed)
Spiritual care group facilitated by Chaplain Dyanne Carrel, [facility]  Group focused on topic of strength. Group members reflected on what thoughts and feelings emerge when they hear this topic. They then engaged in facilitated dialog around how strength is present in their lives. This dialog focused on representing what strength had been to them in their lives (images and patterns given) and what they saw as helpful in their life now (what they needed / wanted).  Activity drew on narrative framework.  Patient Progress: Donna Wagner attended group and actively engaged and participated in group conversation and activities.  Comments demonstrated good insight and contributed positively to the group conversation.
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
## 2023-08-06 NOTE — [facility] Suicide Risk Assessment (Signed)
[facility] INPATIENT:  Family/Significant Other Suicide Prevention Education  Suicide Prevention Education:  Education Completed; Amy Bautch, mom, [phone], has been identified by the patient as the family member/significant other with whom the patient will be residing, and identified as the person(s) who will aid the patient in the event of a mental health crisis (suicidal ideations/suicide attempt).  With written consent from the patient, the family member/significant other has been provided the following suicide prevention education, prior to the and/or following the discharge of the patient.  The suicide prevention education provided includes the following: Suicide risk factors Suicide prevention and interventions National Suicide Hotline telephone number [facility] assessment telephone number [facility] Emergency Assistance 911 [facility] and/or Residential Mobile Crisis Unit telephone number  Request made of family/significant other to: Remove weapons (e.g., guns, rifles, knives), all items previously/currently identified as safety concern.   Remove drugs/medications (over-the-counter, prescriptions, illicit drugs), all items previously/currently identified as a safety concern.  The family member/significant other verbalizes understanding of the suicide prevention education information provided.  The family member/significant other agrees to remove the items of safety concern listed above.  Mom states that pt has been having a "manic episode" on and off for some time now. Mom states this has been one of the worst outbursts that she has seen and she was very concerned for pt's wellbeing. Mom reported that there is no specific thing(s) that "sets her off" but there seems to be somewhat of a pattern with pt's boyfriend and pt's best friend being around.   Mom agrees that pt will be returning home at discharge and pt will be picked up by parents. There are no firearms in  the home and safety education was discussed with mom.   Donna Wagner 08/06/2023, 10:34 AM

## 2023-08-06 NOTE — Plan of Care (Signed)
Problem: Education: Goal: Emotional status will improve Outcome: Progressing Goal: Mental status will improve Outcome: Progressing   Problem: Activity: Goal: Interest or engagement in activities will improve Outcome: Progressing Goal: Sleeping patterns will improve Outcome: Progressing   Problem: Safety: Goal: Periods of time without injury will increase Outcome: Progressing

## 2023-08-06 NOTE — BH IP Treatment Plan (Signed)
Interdisciplinary Treatment and Diagnostic Plan   08/06/2023 Time of Session: 1100 Donna Wagner MRN: [id number]  Principal Diagnosis: MDD (major depressive disorder), recurrent severe, without psychosis (HCC)  Secondary Diagnoses: Principal Problem:   MDD (major depressive disorder), recurrent severe, without psychosis (HCC)   Current Medications:  Current Facility-Administered Medications  Medication Dose Route Frequency Provider Last Rate Last Admin   acetaminophen (TYLENOL) tablet 650 mg  650 mg Oral Q6H PRN Massengill, Harrold Donath, MD       alum & mag hydroxide-simeth (MAALOX/MYLANTA) 200-200-20 MG/5ML suspension 30 mL  30 mL Oral Q4H PRN Massengill, Harrold Donath, MD       ARIPiprazole (ABILIFY) tablet 5 mg  5 mg Oral Daily Massengill, Nathan, MD   5 mg at 08/06/23 7829   diphenhydrAMINE (BENADRYL) capsule 50 mg  50 mg Oral TID PRN Phineas Inches, MD       Or   diphenhydrAMINE (BENADRYL) injection 50 mg  50 mg Intramuscular TID PRN Massengill, Harrold Donath, MD       divalproex (DEPAKOTE ER) 24 hr tablet 500 mg  500 mg Oral QHS Rex Kras, MD   500 mg at 08/05/23 2135   escitalopram (LEXAPRO) tablet 20 mg  20 mg Oral Daily Massengill, Harrold Donath, MD   20 mg at 08/06/23 5621   haloperidol (HALDOL) tablet 5 mg  5 mg Oral TID PRN Phineas Inches, MD       Or   haloperidol lactate (HALDOL) injection 5 mg  5 mg Intramuscular TID PRN Massengill, Harrold Donath, MD       hydrOXYzine (ATARAX) tablet 25 mg  25 mg Oral TID PRN Phineas Inches, MD   25 mg at 08/06/23 0820   LORazepam (ATIVAN) tablet 2 mg  2 mg Oral TID PRN Phineas Inches, MD       Or   LORazepam (ATIVAN) injection 2 mg  2 mg Intramuscular TID PRN Massengill, Harrold Donath, MD       magnesium hydroxide (MILK OF MAGNESIA) suspension 30 mL  30 mL Oral Daily PRN Massengill, Harrold Donath, MD       melatonin tablet 5 mg  5 mg Oral QHS PRN Rex Kras, MD   5 mg at 08/05/23 2135   PTA Medications: Medications Prior to Admission  Medication  Sig Dispense Refill Last Dose   escitalopram (LEXAPRO) 20 MG tablet TAKE 1 TABLET(20 MG) BY MOUTH DAILY (Patient taking differently: Take 20 mg by mouth daily.) 30 tablet 0    hydrOXYzine (VISTARIL) 25 MG capsule Take 1 capsule (25 mg total) by mouth every 8 (eight) hours as needed. 30 capsule 0     Patient Stressors:    Patient Strengths:    Treatment Modalities: Medication Management, Group therapy, Case management,  1 to 1 session with clinician, Psychoeducation, Recreational therapy.   Physician Treatment Plan for Primary Diagnosis: MDD (major depressive disorder), recurrent severe, without psychosis (HCC) Long Term Goal(s): Improvement in symptoms so as ready for discharge   Short Term Goals: Ability to demonstrate self-control will improve Ability to identify and develop effective coping behaviors will improve Ability to maintain clinical measurements within normal limits will improve Compliance with prescribed medications will improve Ability to verbalize feelings will improve Ability to disclose and discuss suicidal ideas  Medication Management: Evaluate patient's response, side effects, and tolerance of medication regimen.  Therapeutic Interventions: 1 to 1 sessions, Unit Group sessions and Medication administration.  Evaluation of Outcomes: Progressing  Physician Treatment Plan for Secondary Diagnosis: Principal Problem:   MDD (major depressive disorder), recurrent severe,  without psychosis (HCC)  Long Term Goal(s): Improvement in symptoms so as ready for discharge   Short Term Goals: Ability to demonstrate self-control will improve Ability to identify and develop effective coping behaviors will improve Ability to maintain clinical measurements within normal limits will improve Compliance with prescribed medications will improve Ability to verbalize feelings will improve Ability to disclose and discuss suicidal ideas     Medication Management: Evaluate patient's  response, side effects, and tolerance of medication regimen.  Therapeutic Interventions: 1 to 1 sessions, Unit Group sessions and Medication administration.  Evaluation of Outcomes: Progressing   RN Treatment Plan for Primary Diagnosis: MDD (major depressive disorder), recurrent severe, without psychosis (HCC) Long Term Goal(s): Knowledge of disease and therapeutic regimen to maintain health will improve  Short Term Goals: Ability to remain free from injury will improve, Ability to verbalize frustration and anger appropriately will improve, Ability to demonstrate self-control, Ability to participate in decision making will improve, Ability to verbalize feelings will improve, Ability to disclose and discuss suicidal ideas, Ability to identify and develop effective coping behaviors will improve, and Compliance with prescribed medications will improve  Medication Management: RN will administer medications as ordered by provider, will assess and evaluate patient's response and provide education to patient for prescribed medication. RN will report any adverse and/or side effects to prescribing provider.  Therapeutic Interventions: 1 on 1 counseling sessions, Psychoeducation, Medication administration, Evaluate responses to treatment, Monitor vital signs and CBGs as ordered, Perform/monitor CIWA, COWS, AIMS and Fall Risk screenings as ordered, Perform wound care treatments as ordered.  Evaluation of Outcomes: Progressing   LCSW Treatment Plan for Primary Diagnosis: MDD (major depressive disorder), recurrent severe, without psychosis (HCC) Long Term Goal(s): Safe transition to appropriate next level of care at discharge, Engage patient in therapeutic group addressing interpersonal concerns.  Short Term Goals: Engage patient in aftercare planning with referrals and resources, Increase social support, Increase ability to appropriately verbalize feelings, Increase emotional regulation, Facilitate  acceptance of mental health diagnosis and concerns, Facilitate patient progression through stages of change regarding substance use diagnoses and concerns, Identify triggers associated with mental health/substance abuse issues, and Increase skills for wellness and recovery  Therapeutic Interventions: Assess for all discharge needs, 1 to 1 time with Social worker, Explore available resources and support systems, Assess for adequacy in community support network, Educate family and significant other(s) on suicide prevention, Complete Psychosocial Assessment, Interpersonal group therapy.  Evaluation of Outcomes: Progressing   Progress in Treatment: Attending groups: Yes. Participating in groups: Yes. Taking medication as prescribed: Yes. Toleration medication: Yes. Family/Significant other contact made: No, will contact:  Declined consents Patient understands diagnosis: Yes. Discussing patient identified problems/goals with staff: Yes. Medical problems stabilized or resolved: Yes. Denies suicidal/homicidal ideation: Yes. Issues/concerns per patient self-inventory: Yes. Other: N/A  New problem(s) identified: No, Describe:  None reported  New Short Term/Long Term Goal(s): medication stabilization, elimination of SI thoughts, development of comprehensive mental wellness plan.     Patient Goals:  Coping Skills  Discharge Plan or Barriers: Patient recently admitted. CSW will continue to follow and assess for appropriate referrals and possible discharge planning.   Reason for Continuation of Hospitalization: Anxiety Depression Medication stabilization Suicidal ideation  Estimated Length of Stay: 5-7 Days  Last 3 [place] Suicide Severity Risk Score: Flowsheet Row Ad[facility] (Current) [facility] from 08/03/2023 in [facility] at [facility] ED from 05/15/2023 in [facility] Department at [facility]  C-SSRS RISK CATEGORY Low Risk No Risk Moderate Risk       Last PHQ 2/9 Scores:    05/15/2023    2:56 PM  Depression screen PHQ 2/9  Decreased Interest 1  Down, Depressed, Hopeless 1  PHQ - 2 Score 2  Altered sleeping 3  Tired, decreased energy 2  Change in appetite 1  Feeling bad or failure about yourself  1  Trouble concentrating 2  Moving slowly or fidgety/restless 2  Suicidal thoughts 1  PHQ-9 Score 14  Difficult doing work/chores Somewhat difficult  detox, medication management for mood stabilization; elimination of SI thoughts; development of comprehensive mental wellness/sobriety plan     Scribe for Treatment Team: Ane Payment, LCSW 08/06/2023 11:51 AM

## 2023-08-06 NOTE — [facility] Group Notes (Signed)
Adult Psychoeducational Group Note  Date:  08/06/2023 Time:  9:42 PM  Group Topic/Focus:  Wrap-Up Group:   The focus of this group is to help patients review their daily goal of treatment and discuss progress on daily workbooks.  Participation Level:  Active  Participation Quality:  Attentive  Affect:  Appropriate  Cognitive:  Alert  Insight: Improving  Engagement in Group:  Engaged  Modes of Intervention:  Discussion  Additional Comments:  Pt attended and participated in group.  Maura Crandall Cassandra 08/06/2023, 9:42 PM

## 2023-08-06 NOTE — Plan of Care (Signed)
  Problem: Education: Goal: Emotional status will improve Outcome: Progressing Goal: Mental status will improve Outcome: Progressing   Problem: Activity: Goal: Interest or engagement in activities will improve Outcome: Progressing Goal: Sleeping patterns will improve Outcome: Progressing

## 2023-08-06 NOTE — Plan of Care (Signed)
  Problem: Education: Goal: Emotional status will improve Outcome: Progressing   Problem: Education: Goal: Mental status will improve Outcome: Progressing   Problem: Activity: Goal: Interest or engagement in activities will improve Outcome: Progressing Goal: Sleeping patterns will improve Outcome: Progressing   Problem: Coping: Goal: Ability to verbalize frustrations and anger appropriately will improve Outcome: Progressing Goal: Ability to demonstrate self-control will improve Outcome: Progressing   Problem: Safety: Goal: Periods of time without injury will increase Outcome: Progressing

## 2023-08-07 ENCOUNTER — Telehealth: Payer: Self-pay

## 2023-08-07 DIAGNOSIS — F332 Major depressive disorder, recurrent severe without psychotic features: Secondary | ICD-10-CM | POA: Diagnosis not present

## 2023-08-07 LAB — BASIC METABOLIC PANEL
Anion gap: 10 (ref 5–15)
BUN: 9 mg/dL (ref 6–20)
CO2: 25 mmol/L (ref 22–32)
Calcium: 9.3 mg/dL (ref 8.9–10.3)
Chloride: 102 mmol/L (ref 98–111)
Creatinine, Ser: 0.61 mg/dL (ref 0.44–1.00)
GFR, Estimated: >60 mL/min (ref >60)
Glucose, Bld: 87 mg/dL (ref 70–99)
Potassium: 4.1 mmol/L (ref 3.5–5.1)
Sodium: 137 mmol/L (ref 135–145)

## 2023-08-07 LAB — HEMOGLOBIN A1C
Hgb A1c MFr Bld: 5.2 % (ref 4.8–5.6)
Mean Plasma Glucose: 102.54 mg/dL

## 2023-08-07 LAB — VITAMIN D 25 HYDROXY (VIT D DEFICIENCY, FRACTURES): Vit D, 25-Hydroxy: 23.97 ng/mL — ABNORMAL LOW (ref 30–100)

## 2023-08-07 MED ORDER — BACITRACIN-NEOMYCIN-POLYMYXIN OINTMENT TUBE
TOPICAL_OINTMENT | Freq: Two times a day (BID) | CUTANEOUS | Status: DC
  Filled 2023-08-07: qty 14.17

## 2023-08-07 MED ORDER — ENSURE ENLIVE PO LIQD
237.0000 mL | Freq: Three times a day (TID) | ORAL | Status: DC
  Administered 2023-08-07 – 2023-08-11 (×7): 237 mL via ORAL
  Filled 2023-08-07 (×19): qty 237

## 2023-08-07 MED ORDER — TAB-A-VITE/IRON PO TABS
1.0000 | ORAL_TABLET | Freq: Every day | ORAL | Status: DC
  Administered 2023-08-07 – 2023-08-11 (×5): 1 via ORAL
  Filled 2023-08-07 (×8): qty 1

## 2023-08-07 NOTE — [facility] Group Notes (Signed)
Pt did not attend goals group. 

## 2023-08-07 NOTE — Progress Notes (Signed)
Pt stated she was feeling better today than when she first came in    08/07/23 2230  Psych Admission Type (Psych Patients Only)  Admission Status Involuntary  Psychosocial Assessment  Patient Complaints Anxiety  Eye Contact Fair  Facial Expression Anxious  Affect Depressed  Speech Soft  Interaction Assertive  Motor Activity Slow  Appearance/Hygiene Improved  Behavior Characteristics Cooperative  Mood Anxious;Pleasant  Aggressive Behavior  Effect No apparent injury  Thought Process  Coherency WDL  Content WDL  Delusions WDL  Perception WDL  Hallucination None reported or observed  Judgment WDL  Confusion WDL  Danger to Self  Current suicidal ideation? Denies

## 2023-08-07 NOTE — Transitions of Care (Post Inpatient/ED Visit) (Signed)
pt seen [facility] ED on 08/04/23 and pt was admitted to [facility]. sending FYI to Vallery Ridge NP.        08/07/2023  Name: Donna Wagner MRN: [id number] DOB: [birth year]  Today's TOC FU Call Status: Today's TOC FU Call Status::  (pt seen [facility] ED on 08/04/23 and pt was admitted to [facility]. sending FYI to Vallery Ridge NP.)  Attempted to reach the patient regarding the most recent Inpatient/ED visit.  Follow Up Plan: No further outreach attempts will be made at this time. We have been unable to contact the patient.  Signature Lewanda Rife, LPN

## 2023-08-07 NOTE — Plan of Care (Signed)
  Problem: Education: Goal: Emotional status will improve Outcome: Progressing   Problem: Activity: Goal: Interest or engagement in activities will improve Outcome: Progressing Goal: Sleeping patterns will improve Outcome: Progressing   Problem: Activity: Goal: Sleeping patterns will improve Outcome: Progressing   Problem: Education: Goal: Mental status will improve Outcome: Progressing
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
## 2023-08-07 NOTE — Group Note (Signed)
[facility] LCSW Group Therapy Note   Group Date: 08/07/2023 Start Time: 1100 End Time: 1145  Type of Therapy/Topic:  Group Therapy:  Feelings about Diagnosis  Participation Level:  Did Not Attend      Description of Group:    This group will allow patients to explore their thoughts and feelings about diagnoses they have received. Patients will be guided to explore their level of understanding and acceptance of these diagnoses. Facilitator will encourage patients to process their thoughts and feelings about the reactions of others to their diagnosis, and will guide patients in identifying ways to discuss their diagnosis with significant others in their lives. This group will be process-oriented, with patients participating in exploration of their own experiences as well as giving and receiving support and challenge from other group members.   Therapeutic Goals: 1. Patient will demonstrate understanding of diagnosis as evidence by identifying two or more symptoms of the disorder:  2. Patient will be able to express two feelings regarding the diagnosis 3. Patient will demonstrate ability to communicate their needs through discussion and/or role plays      Therapeutic Modalities:   Cognitive Behavioral Therapy Brief Therapy Feelings Identification    Kynedi Profitt S Latoia Eyster, LCSW

## 2023-08-07 NOTE — [facility] Group Notes (Signed)
[facility] Group Notes:  (Nursing/MHT/Case Management/Adjunct)  Date:  08/07/2023  Time:  9:30 PM  Type of Therapy:   Wrap-up group  Participation Level:  Active  Participation Quality:  Appropriate  Affect:  Appropriate  Cognitive:  Appropriate  Insight:  Appropriate  Engagement in Group:  Engaged  Modes of Intervention:  Education  Summary of Progress/Problems: Pt goal to make it thru the day. Rated her day 4/10.  Noah Delaine 08/07/2023, 9:30 PM

## 2023-08-07 NOTE — Progress Notes (Signed)
   08/07/23 0820  Psych Admission Type (Psych Patients Only)  Admission Status Involuntary  Psychosocial Assessment  Patient Complaints Anxiety  Eye Contact Fair  Facial Expression Anxious  Affect Anxious  Speech Logical/coherent  Interaction Assertive  Motor Activity Other (Comment) (wnl)  Appearance/Hygiene Unremarkable  Behavior Characteristics Cooperative  Mood Anxious;Pleasant  Aggressive Behavior  Effect No apparent injury  Thought Process  Coherency WDL  Content WDL  Delusions None reported or observed  Perception WDL  Hallucination None reported or observed  Judgment WDL  Confusion WDL  Danger to Self  Current suicidal ideation? Denies  Danger to Others  Danger to Others None reported or observed

## 2023-08-07 NOTE — Plan of Care (Signed)
  Problem: Education: Goal: Emotional status will improve Outcome: Progressing Goal: Mental status will improve Outcome: Progressing   Problem: Activity: Goal: Interest or engagement in activities will improve Outcome: Progressing Goal: Sleeping patterns will improve Outcome: Progressing   Problem: Physical Regulation: Goal: Ability to maintain clinical measurements within normal limits will improve Outcome: Progressing   Problem: Safety: Goal: Periods of time without injury will increase Outcome: Progressing

## 2023-08-08 DIAGNOSIS — F332 Major depressive disorder, recurrent severe without psychotic features: Secondary | ICD-10-CM | POA: Diagnosis not present

## 2023-08-08 LAB — VALPROIC ACID LEVEL: Valproic Acid Lvl: 67 ug/mL (ref 50.0–100.0)

## 2023-08-08 MED ORDER — WHITE PETROLATUM EX OINT
TOPICAL_OINTMENT | CUTANEOUS | Status: AC
  Filled 2023-08-08: qty 5

## 2023-08-08 MED ORDER — POLYETHYLENE GLYCOL 3350 17 G PO PACK: 17.0000 g | PACK | Freq: Every day | ORAL | Status: DC | PRN

## 2023-08-08 MED ORDER — VITAMIN D (ERGOCALCIFEROL) 1.25 MG (50000 UNIT) PO CAPS
50000.0000 [IU] | ORAL_CAPSULE | ORAL | Status: DC
  Administered 2023-08-08: 50000 [IU] via ORAL
  Filled 2023-08-08 (×2): qty 1

## 2023-08-08 MED ORDER — POLYETHYLENE GLYCOL 3350 17 G PO PACK
17.0000 g | PACK | Freq: Once | ORAL | Status: DC
  Filled 2023-08-08: qty 1

## 2023-08-08 NOTE — Progress Notes (Signed)
D: Patient is alert, oriented, pleasant, and cooperative. Denies SI, HI, AVH, and verbally contracts for safety. Patient reports she slept good last night with sleeping medication. Patient reports her appetite as fair, energy level as normal, and concentration as good. Patient rates her depression 4/10, hopelessness 4/10, and anxiety 4/10.    A: Scheduled medications administered per MD order. Support provided. Patient educated on safety on the unit and medications. Routine safety checks every 15 minutes. Patient stated understanding to tell nurse about any new physical symptoms. Patient understands to tell staff of any needs.     R: No adverse drug reactions noted. Patient remains safe at this time and will continue to monitor.    08/08/23 1200  Psych Admission Type (Psych Patients Only)  Admission Status Involuntary  Psychosocial Assessment  Patient Complaints Anxiety  Eye Contact Fair  Facial Expression Anxious  Affect Anxious  Speech Logical/coherent  Interaction Assertive  Motor Activity Other (Comment) (WNL)  Appearance/Hygiene Unremarkable  Behavior Characteristics Cooperative  Mood Anxious;Pleasant  Thought Process  Coherency WDL  Content WDL  Delusions None reported or observed  Perception WDL  Hallucination None reported or observed  Judgment WDL  Confusion WDL  Danger to Self  Current suicidal ideation? Denies  Danger to Others  Danger to Others None reported or observed

## 2023-08-08 NOTE — [facility] Group Notes (Signed)
Adult Psychoeducational Group Note  Date:  08/08/2023 Time:  10:45 PM  Group Topic/Focus:  Wrap-Up Group:   The focus of this group is to help patients review their daily goal of treatment and discuss progress on daily workbooks.  Participation Level:  Active  Participation Quality:  Appropriate  Affect:  Appropriate  Cognitive:  Appropriate  Insight: Appropriate  Engagement in Group:  Engaged  Modes of Intervention:  Discussion  Additional Comments:  Pt attended and participated in AA group.  Donna Wagner 08/08/2023, 10:45 PM

## 2023-08-08 NOTE — Progress Notes (Signed)
   08/08/23 2015  Psych Admission Type (Psych Patients Only)  Admission Status Involuntary  Psychosocial Assessment  Patient Complaints Anxiety  Eye Contact Fair  Facial Expression Anxious  Affect Depressed  Speech Soft  Interaction Assertive  Motor Activity Slow  Appearance/Hygiene Improved  Behavior Characteristics Cooperative  Mood Anxious;Pleasant  Aggressive Behavior  Effect No apparent injury  Thought Process  Coherency WDL  Content WDL  Delusions WDL  Perception WDL  Hallucination None reported or observed  Judgment WDL  Confusion WDL  Danger to Self  Current suicidal ideation? Denies

## 2023-08-08 NOTE — Group Note (Signed)
Recreation Therapy Group Note   Group Topic:Problem Solving  Group Date: 08/08/2023 Start Time: 0935 End Time: 1007 Facilitators: Shweta Aman-McCall, LRT,CTRS Location: [address] Dayroom   Goal Area(s) Addresses:  Patient will effectively work in a team with other group members. Patient will verbalize importance of using appropriate problem solving techniques.  Patient will identify positive change associated with effective problem solving skills.   Group Description: Brain Teasers. Patients were given two sheets of brain teasers. Patients worked together to Radiation protection practitioner.     Education Outcome: Acknowledges understanding/In group clarification offered/Needs additional education.    Affect/Mood: Appropriate   Participation Level: Engaged   Participation Quality: Independent   Behavior: Appropriate   Speech/Thought Process: Focused   Insight: Good   Judgement: Good   Modes of Intervention: Group work   Patient Response to Interventions:  Engaged   Education Outcome:  In group clarification offered    Clinical Observations/Individualized Feedback: Pt was bright, engaged and worked well with peers in figuring out the answers.     Plan: Continue to engage patient in RT group sessions 2-3x/week.   Shaquill Iseman-McCall, LRT,CTRS 08/08/2023 12:07 PM

## 2023-08-08 NOTE — Plan of Care (Signed)
  Problem: Education: Goal: Emotional status will improve Outcome: Progressing Goal: Mental status will improve Outcome: Progressing   Problem: Activity: Goal: Interest or engagement in activities will improve Outcome: Progressing Goal: Sleeping patterns will improve Outcome: Progressing   Problem: Safety: Goal: Periods of time without injury will increase Outcome: Progressing   
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
## 2023-08-08 NOTE — Group Note (Signed)
Date:  08/08/2023 Time:  10:32 AM  Group Topic/Focus:  Goals Group:   The focus of this group is to help patients establish daily goals to achieve during treatment and discuss how the patient can incorporate goal setting into their daily lives to aide in recovery.    Participation Level:  Active  Participation Quality:  Attentive  Affect:  Appropriate  Cognitive:  Appropriate  Insight: Appropriate  Engagement in Group:  Engaged  Modes of Intervention:  Discussion   Additional Comments:     [name] 08/08/2023, 10:32 AM

## 2023-08-09 MED ORDER — PRAZOSIN HCL 1 MG PO CAPS
1.0000 mg | ORAL_CAPSULE | Freq: Every day | ORAL | Status: DC
  Administered 2023-08-09: 1 mg via ORAL
  Filled 2023-08-09 (×3): qty 1

## 2023-08-09 NOTE — Plan of Care (Signed)
  Problem: Education: Goal: Emotional status will improve Outcome: Progressing Goal: Mental status will improve Outcome: Progressing   

## 2023-08-09 NOTE — Group Note (Signed)
Date:  08/09/2023 Time:  2:29 PM  Group Topic/Focus:  Goals Group:   The focus of this group is to help patients establish daily goals to achieve during treatment and discuss how the patient can incorporate goal setting into their daily lives to aide in recovery.    Participation Level:  Active  Participation Quality:  Appropriate  Affect:  Appropriate  Cognitive:  Appropriate  Insight: Appropriate  Engagement in Group:  Engaged  Modes of Intervention:  Discussion  Additional Comments:     [name] 08/09/2023, 2:29 PM

## 2023-08-09 NOTE — Progress Notes (Signed)
   08/09/23 1000  Psych Admission Type (Psych Patients Only)  Admission Status Involuntary  Psychosocial Assessment  Patient Complaints None  Eye Contact Fair  Facial Expression Anxious  Affect Anxious  Speech Logical/coherent  Interaction Assertive  Motor Activity Slow  Appearance/Hygiene Unremarkable  Behavior Characteristics Cooperative;Appropriate to situation  Mood Anxious;Pleasant  Aggressive Behavior  Effect No apparent injury  Thought Process  Coherency WDL  Content WDL  Delusions WDL  Perception WDL  Hallucination None reported or observed  Judgment WDL  Confusion None  Danger to Self  Current suicidal ideation? Denies  Danger to Others  Danger to Others None reported or observed

## 2023-08-09 NOTE — Progress Notes (Signed)
Psychiatric progress note  Patient Identification: Donna Wagner MRN:  [id number] Date of Evaluation:  08/09/2023 Chief Complaint:  MDD (major depressive disorder), recurrent severe, without psychosis (HCC) [F33.2] Principal Diagnosis: Bipolar II disorder (HCC) Diagnosis:  Principal Problem:   Bipolar II disorder (HCC) Active Problems:   PTSD (post-traumatic stress disorder)   Reason for admission   The patient is a 17 year old Caucasian female with a past history of depression and PTSD who presented to the ED for evaluation for anxiety & anger outbursts.  Chart review from last 24 hours    She is compliant with medications and reports no behavioral problems. PRN med last night was Melatonin for sleep. Patient only slept 4.25h recorded.   Information obtained during interview   Patient endorses that she feels better than on initial presentation. Patient reports that she is not sleeping well, but thinks she can try to go to bed earlier tonight. Patient also endorses that she when she does sleep it is not restful, reporting that she is having nightmares related to her trauma history. Patient reports that otherwise she is getting along well with others on the unit. Patient reports that her appetite is improving and denies SI, HI, and AVH.   Total Time spent with patient: 20 minutes  Past Psychiatric History: Patient was hospitalized once for depression when she was about 14.  She also gives a history of ADHD, depression and anxiety.  Patient endorses history of PTSD with frequent flashbacks.  Is the patient at risk to self? Yes.    Has the patient been a risk to self in the past 6 months? Yes.    Has the patient been a risk to self within the distant past? Yes.    Is the patient a risk to others? Yes.    Has the patient been a risk to others in the past 6 months? Yes.    Has the patient been a risk to others within the distant past? No.   [place] Scale:  Flowsheet Row Admission  (Current) [facility] from 08/03/2023 in [facility] Department at [facility] ED from 05/15/2023 in [facility] Emergency Department at [facility]  C-SSRS RISK CATEGORY Low Risk No Risk Moderate Risk        Prior Inpatient Therapy: Yes.   If yes, describe 1 hospitalization at age 69 Prior Outpatient Therapy: Yes.   If yes, describe recently started seeing a therapist and a psychiatrist.  Alcohol Screening: 1. How often do you have a drink containing alcohol?: Never 2. How many drinks containing alcohol do you have on a typical day when you are drinking?: 1 or 2 3. How often do you have six or more drinks on one occasion?: Never AUDIT-C Score: 0 4. How often during the last year have you found that you were not able to stop drinking once you had started?: Never 5. How often during the last year have you failed to do what was normally expected from you because of drinking?: Never 6. How often during the last year have you needed a first drink in the morning to get yourself going after a heavy drinking session?: Never 7. How often during the last year have you had a feeling of guilt of remorse after drinking?: Never 8. How often during the last year have you been unable to remember what happened the night before because you had been drinking?: Never 9. Have you or someone else been  injured as a result of your drinking?: No 10. Has a relative or friend or a doctor or another health worker been concerned about your drinking or suggested you cut down?: No Alcohol Use Disorder Identification Test Final Score (AUDIT): 0 Alcohol Brief Interventions/Follow-up: Alcohol education/Brief advice Substance Abuse History in the last 12 months:  Yes.   Consequences of Substance Abuse: Patient uses marijuana on a daily basis.  May be contributing to her mood swings. Previous Psychotropic Medications: Yes  Psychological Evaluations:  Yes  Past Medical History: History reviewed. No pertinent past medical history.  Past Surgical History:  Procedure Laterality Date   APPENDECTOMY     XI ROBOTIC LAPAROSCOPIC ASSISTED APPENDECTOMY N/A 11/19/2022   Procedure: XI ROBOTIC LAPAROSCOPIC ASSISTED APPENDECTOMY;  Surgeon: Carolan Shiver, MD;  Location: [facility] ORS;  Service: General;  Laterality: N/A;   Family History:  Family History  Problem Relation Age of Onset   Hypertension Mother    Depression Mother    Mental illness Mother    Mental illness Father    Mental illness Sister    Depression Sister    Mental illness Maternal Grandmother    Early death Maternal Grandmother    Depression Maternal Grandmother    Cancer Maternal Grandfather    Learning disabilities Paternal Grandfather    Family Psychiatric  History: Family history is positive for anxiety and depression. Tobacco Screening:  Social History   Tobacco Use  Smoking Status Never   Passive exposure: Never  Smokeless Tobacco Never    BH Tobacco Counseling     Are you interested in Tobacco Cessation Medications?  N/A, patient does not use tobacco products Counseled patient on smoking cessation:  N/A, patient does not use tobacco products Reason Tobacco Screening Not Completed: No value filed.       Social History:  Social History   Substance and Sexual Activity  Alcohol Use Not Currently     Social History   Substance and Sexual Activity  Drug Use Yes   Frequency: 7.0 times per week   Types: Marijuana   Comment: smokes every other day    Additional Social History: Marital status: Single What is your sexual orientation?: Not reported Has your sexual activity been affected by drugs, alcohol, medication, or emotional stress?: Not reported Does patient have children?: No                         Allergies:   Allergies  Allergen Reactions   Zoloft [Sertraline] Other (See Comments)    Does not work for pt   Lab Results:   Results for orders placed or performed during the hospital encounter of 08/04/23 (from the past 48 hour(s))  Valproic acid level     Status: None   Collection Time: 08/08/23  6:26 PM  Result Value Ref Range   Valproic Acid Lvl 67 50.0 - 100.0 ug/mL    Comment: Performed at [facility], [address], [place], [place] [address]     Blood Alcohol level:  Lab Results  Component Value Date   [facility] <10 08/03/2023   ETH <10 05/15/2023    Metabolic Disorder Labs:  Lab Results  Component Value Date   HGBA1C 5.2 08/07/2023   MPG 102.54 08/07/2023   MPG 96.8 12/20/2020   Lab Results  Component Value Date   PROLACTIN 29.1 (H) 12/20/2020   Lab Results  Component Value Date   CHOL 142 05/22/2023   TRIG 43.0 05/22/2023  HDL 45.30 05/22/2023   CHOLHDL 3 05/22/2023   VLDL 8.6 05/22/2023   LDLCALC 88 05/22/2023   LDLCALC 82 12/20/2020    Current Medications: Current Facility-Administered Medications  Medication Dose Route Frequency Provider Last Rate Last Admin   acetaminophen (TYLENOL) tablet 650 mg  650 mg Oral Q6H PRN Massengill, Harrold Donath, MD       alum & mag hydroxide-simeth (MAALOX/MYLANTA) 200-200-20 MG/5ML suspension 30 mL  30 mL Oral Q4H PRN Massengill, Harrold Donath, MD       ARIPiprazole (ABILIFY) tablet 5 mg  5 mg Oral Daily Massengill, Nathan, MD   5 mg at 08/09/23 1610   diphenhydrAMINE (BENADRYL) capsule 50 mg  50 mg Oral TID PRN Phineas Inches, MD       Or   diphenhydrAMINE (BENADRYL) injection 50 mg  50 mg Intramuscular TID PRN Massengill, Harrold Donath, MD       divalproex (DEPAKOTE ER) 24 hr tablet 500 mg  500 mg Oral QHS Rex Kras, MD   500 mg at 08/08/23 2142   escitalopram (LEXAPRO) tablet 20 mg  20 mg Oral Daily Massengill, Harrold Donath, MD   20 mg at 08/09/23 9604   feeding supplement (ENSURE ENLIVE / ENSURE PLUS) liquid 237 mL  237 mL Oral TID BM Starleen Blue, NP   237 mL at 08/09/23 1408   haloperidol (HALDOL) tablet 5 mg  5 mg Oral TID PRN  Phineas Inches, MD       Or   haloperidol lactate (HALDOL) injection 5 mg  5 mg Intramuscular TID PRN Massengill, Harrold Donath, MD       hydrOXYzine (ATARAX) tablet 25 mg  25 mg Oral TID PRN Phineas Inches, MD   25 mg at 08/09/23 1407   LORazepam (ATIVAN) tablet 2 mg  2 mg Oral TID PRN Phineas Inches, MD       Or   LORazepam (ATIVAN) injection 2 mg  2 mg Intramuscular TID PRN Massengill, Harrold Donath, MD       magnesium hydroxide (MILK OF MAGNESIA) suspension 30 mL  30 mL Oral Daily PRN Massengill, Harrold Donath, MD       melatonin tablet 5 mg  5 mg Oral QHS PRN Rex Kras, MD   5 mg at 08/08/23 2142   multivitamins with iron tablet 1 tablet  1 tablet Oral Daily Starleen Blue, NP   1 tablet at 08/09/23 5409   neomycin-bacitracin-polymyxin (NEOSPORIN) ointment   Topical BID Starleen Blue, NP   Given at 08/07/23 2117   polyethylene glycol (MIRALAX / GLYCOLAX) packet 17 g  17 g Oral Once Starleen Blue, NP       polyethylene glycol (MIRALAX / GLYCOLAX) packet 17 g  17 g Oral Daily PRN Starleen Blue, NP       prazosin (MINIPRESS) capsule 1 mg  1 mg Oral QHS Bobbye Morton, MD       Vitamin D (Ergocalciferol) (DRISDOL) 1.25 MG (50000 UNIT) capsule 50,000 Units  50,000 Units Oral Q7 days Starleen Blue, NP   50,000 Units at 08/08/23 2142   PTA Medications: Medications Prior to Admission  Medication Sig Dispense Refill Last Dose   escitalopram (LEXAPRO) 20 MG tablet TAKE 1 TABLET(20 MG) BY MOUTH DAILY (Patient taking differently: Take 20 mg by mouth daily.) 30 tablet 0    hydrOXYzine (VISTARIL) 25 MG capsule Take 1 capsule (25 mg total) by mouth every 8 (eight) hours as needed. 30 capsule 0    Musculoskeletal: Strength & Muscle Tone: within normal limits Gait & Station: normal Patient leans:  N/A  Psychiatric Specialty Exam:  Presentation  General Appearance:  Appropriate for Environment; Casual  Eye Contact: Good  Speech: Clear and Coherent  Speech  Volume: Normal  Handedness: Right   Mood and Affect  Mood: Euthymic  Affect: Appropriate   Thought Process  Thought Processes: Coherent  Duration of Psychotic Symptoms:N/A Past Diagnosis of Schizophrenia or Psychoactive disorder: No  Descriptions of Associations:Intact  Orientation:Full (Time, Place and Person)  Thought Content:Logical  Hallucinations:Hallucinations: None  Ideas of Reference:None  Suicidal Thoughts:Suicidal Thoughts: No  Homicidal Thoughts:Homicidal Thoughts: No   Sensorium  Memory: Immediate Good; Recent Good  Judgment: Fair  Insight: Fair   Art therapist  Concentration: Good  Attention Span: Fair  Recall: Good  Fund of Knowledge: Good  Language: Good   Psychomotor Activity  Psychomotor Activity: Psychomotor Activity: Normal   Assets  Assets: Desire for Improvement; Communication Skills; Social Support; Housing   Sleep  Sleep: Sleep: Poor Number of Hours of Sleep: 4.75    Physical Exam: Physical Exam Constitutional:      Appearance: Normal appearance. She is normal weight.  HENT:     Head: Normocephalic.  Neurological:     General: No focal deficit present.     Mental Status: She is alert and oriented to person, place, and time. Mental status is at baseline.  Psychiatric:        Mood and Affect: Mood normal.        Behavior: Behavior normal.    Review of Systems  Constitutional: Negative.  Negative for fever.  HENT: Negative.  Negative for hearing loss.   Eyes: Negative.   Respiratory: Negative.  Negative for cough.   Cardiovascular: Negative.  Negative for chest pain.  Gastrointestinal:  Negative for heartburn.  Musculoskeletal: Negative.   Skin: Negative.   Neurological:  Negative for dizziness.  Psychiatric/Behavioral:  Positive for substance abuse. Negative for depression, hallucinations, memory loss and suicidal ideas. The patient is not nervous/anxious and does not have insomnia.    All other systems reviewed and are negative.  Blood pressure 114/84, pulse 78, temperature 97.9 F (36.6 C), temperature source Oral, resp. rate 20, height 5\' 4"  (1.626 m), weight 54.6 kg, last menstrual period 08/03/2023, SpO2 100%. Body mass index is 20.67 kg/m.  Treatment Plan Summary: Daily contact with patient to assess and evaluate symptoms and progress in treatment, Medication management, and Plan the patient is admitted to the safe and secure environment for further observation and treatment.  The patient gives a history of depression, passive suicidal ideations, mood swings and PTSD symptoms.  She is seeking treatment and is contracting for safety.  Will start Prazosin 1mg  at bedtime for nightmares. Patient BP normotensive. Patient educated about possible side effects including light headedness and staying hydrated. Patient Depakote lvl therapeutic at current dose. Will continue to monitor given poor sleep, but treating for nightmares may help improve time sleeping. Patient also endorses that she is starting to feel that she needs more sleep.   Observation Level/Precautions:  15 minute checks  Laboratory: Labs reviewed: Depakote level 67, Vit D 23.97  Psychotherapy: Individual and group.  Medications:  Continue with Lexapro 20 mg a day Continue with Abilify 5 mg a day Melatonin 3 mg at night as needed Start Minipress 1 mg at night Continue Depakote ER 500 mg - Continue Miralax daily PRN for constipation Neosporin BID for self inflicted bite to L knee  Consultations: None  Discharge Concerns: Unpredictable mood swings   Estimated LOS: Possibly by Monday.  Other:     Physician Treatment Plan for Primary Diagnosis: Bipolar II disorder (HCC) Long Term Goal(s): Improvement in symptoms so as ready for discharge  Short Term Goals: Ability to verbalize feelings will improve, Ability to disclose and discuss suicidal ideas, and Ability to demonstrate self-control will  improve  Physician Treatment Plan for Secondary Diagnosis: Principal Problem:   Bipolar II disorder (HCC) Active Problems:   PTSD (post-traumatic stress disorder)  Long Term Goal(s): Improvement in symptoms so as ready for discharge  Short Term Goals: Ability to demonstrate self-control will improve, Ability to identify and develop effective coping behaviors will improve, Ability to maintain clinical measurements within normal limits will improve, and Compliance with prescribed medications will improve  I certify that inpatient services furnished can reasonably be expected to improve the patient's condition.    Bobbye Morton, MD 10/19/20243:10 PM Patient ID: Donna Wagner, female   DOB: Dec 17, 2005, 17 y.o.   MRN: [id number]

## 2023-08-09 NOTE — [facility] Group Notes (Signed)
[facility] Group Notes:  (Nursing/MHT/Case Management/Adjunct)  Date:  08/09/2023  Time:  2:25 PM  Type of Therapy:  Psychoeducational Skills  Participation Level:  Active  Participation Quality:  Appropriate  Affect:  Appropriate  Cognitive:  Alert and Appropriate  Insight:  Appropriate  Engagement in Group:  Engaged  Modes of Intervention:  Discussion, Education, and Exploration  Summary of Progress/Problems:  Patients were educated on positive reframing, and the impacts it can have on mental well being. Pts were then given a podcast to listen to by '' [employer] with Berniece Pap '' to identify healthy behaviors and habits to impact mental health. Pt attended and was appropriate.   Malva Limes 08/09/2023, 2:25 PM

## 2023-08-09 NOTE — [facility] Group Notes (Signed)
[facility] Group Notes:  (Nursing/MHT/Case Management/Adjunct)  Date:  08/09/2023  Time:  9:22 PM  Type of Therapy:   Wrap-up group  Participation Level:  Active  Participation Quality:  Appropriate  Affect:  Appropriate  Cognitive:  Appropriate  Insight:  Appropriate  Engagement in Group:  Engaged  Modes of Intervention:  Education  Summary of Progress/Problems: Pt goal to connect with nature. Rated her day 8/10.  Noah Delaine 08/09/2023, 9:22 PM

## 2023-08-09 NOTE — Progress Notes (Signed)
   08/09/23 2242  Psych Admission Type (Psych Patients Only)  Admission Status Involuntary  Psychosocial Assessment  Patient Complaints None  Eye Contact Fair  Facial Expression Animated  Affect Appropriate to circumstance  Speech Logical/coherent  Interaction Assertive  Motor Activity Other (Comment) (WDL)  Appearance/Hygiene Unremarkable  Behavior Characteristics Appropriate to situation  Mood Pleasant  Thought Process  Coherency WDL  Content WDL  Delusions None reported or observed  Perception WDL  Hallucination None reported or observed  Judgment WDL  Confusion None  Danger to Self  Current suicidal ideation? Denies  Danger to Others  Danger to Others None reported or observed

## 2023-08-10 DIAGNOSIS — F332 Major depressive disorder, recurrent severe without psychotic features: Secondary | ICD-10-CM | POA: Diagnosis not present

## 2023-08-10 MED ORDER — PROPRANOLOL HCL 10 MG PO TABS
10.0000 mg | ORAL_TABLET | Freq: Two times a day (BID) | ORAL | Status: DC
  Administered 2023-08-10 – 2023-08-11 (×2): 10 mg via ORAL
  Filled 2023-08-10 (×7): qty 1

## 2023-08-10 NOTE — Progress Notes (Signed)
   08/10/23 1000  Psych Admission Type (Psych Patients Only)  Admission Status Involuntary  Psychosocial Assessment  Patient Complaints Other (Comment) (c/o dizziness this am)  Eye Contact Fair  Facial Expression Animated  Affect Appropriate to circumstance  Speech Logical/coherent  Interaction Assertive  Motor Activity Other (Comment) (steady gait)  Appearance/Hygiene Unremarkable  Behavior Characteristics Cooperative  Mood Pleasant  Aggressive Behavior  Effect No apparent injury  Thought Process  Coherency WDL  Content WDL  Delusions None reported or observed  Perception WDL  Hallucination None reported or observed  Judgment WDL  Confusion None  Danger to Self  Current suicidal ideation? Denies  Danger to Others  Danger to Others None reported or observed

## 2023-08-10 NOTE — Progress Notes (Signed)
   08/10/23 0529  15 Minute Checks  Location Bedroom  Visual Appearance Calm  Behavior Sleeping  Sleep (Behavioral Health Patients Only)  Calculate sleep? (Click Yes once per 24 hr at 0600 safety check) Yes  Documented sleep last 24 hours 7.75

## 2023-08-10 NOTE — Plan of Care (Signed)
  Problem: Education: Goal: Verbalization of understanding the information provided will improve Outcome: Progressing   Problem: Activity: Goal: Sleeping patterns will improve Outcome: Progressing   

## 2023-08-10 NOTE — Group Note (Addendum)
[facility] LCSW Group Therapy Note  08/10/2023  10:00-11:00AM  Type of Therapy and Topic:  Group Therapy:  Building Supports  Participation Level:  Active   Description of Group:  Patients in this group were introduced to the idea of adding a variety of healthy supports to address the various needs in their lives.  Different types of support were defined and described, and each type was acted out.  Patients discussed what additional healthy supports could be helpful in their recovery and wellness after discharge in order to prevent future hospitalizations.   An emphasis was placed on following up with the discharge plan when they leave the hospital in order to continue becoming healthier and happier.  Two songs were played during group to help further patients' understanding.  Therapeutic Goals: 1)  demonstrate the importance of adding supports  2)  discuss 4 definitions of support  3)  identify the patient's current level of healthy support and   4)  elicit commitments to add one healthy support   Summary of Patient Progress:  The patient expressed full comprehension of the concepts presented, and agreed that there is a need to add more supports.  Current supports include immediate family members.  The patient talked about multiple examples several times during group.  She participated fully and did leave group just a few minutes early before the group was asked what supports they are willing to put in place.  Therapeutic Modalities:   Psychoeducation Brief Solution-Focused Therapy  Lynnell Chad, LCSW 08/10/2023 11:42 AM

## 2023-08-10 NOTE — [facility] Group Notes (Signed)
[facility] Group Notes:  (Nursing/MHT/Case Management/Adjunct)  Date:  08/10/2023  Time:  9:04 PM  Type of Therapy:  Psychoeducational Skills  Participation Level:  Active  Participation Quality:  Appropriate  Affect:  Blunted  Cognitive:  Appropriate  Insight:  Improving  Engagement in Group:  Developing/Improving  Modes of Intervention:  Education  Summary of Progress/Problems: Patient rated her day as a 6 out of a possible 10. The patient stated that she spoke with some of her friends today and that she felt light headed earlier in the day. She did not accomplish her goal for today which was to sit still longer and be less "fidgety".   Hazle Coca S 08/10/2023, 9:04 PM

## 2023-08-10 NOTE — Progress Notes (Signed)
Psychiatric progress note  Patient Identification: Donna Wagner MRN:  [id number] Date of Evaluation:  08/10/2023 Chief Complaint:  MDD (major depressive disorder), recurrent severe, without psychosis (HCC) [F33.2] Principal Diagnosis: Bipolar II disorder (HCC) Diagnosis:  Principal Problem:   Bipolar II disorder (HCC) Active Problems:   PTSD (post-traumatic stress disorder)   Reason for admission   The patient is a 17 year old Caucasian female with a past history of depression and PTSD who presented to the ED for evaluation for anxiety & anger outbursts.  Chart review from last 24 hours   Vitals with some elevations in heart rate which have been ongoing consistently since admission to the hospital. Taking meds, and remains compliant. Slept last night as per nursing reports, required hydroxyzine 25 mg last night for anxiety, required melatonin 5 mg last night for sleep.  No behavioral episodes in the past 24 hours.  Information obtained during interview   On assessment today, the pt reports that their mood is euthymic, improved since admission, and stable. Denies feeling down, depressed, or sad.  Reports that anxiety symptoms are at manageable level.  Sleep is stable. Appetite is stable.  Concentration is without complaint.  Energy level is adequate. Denies having any suicidal thoughts. Denies having any suicidal intent and plan.  Denies having any HI.  Denies having psychotic symptoms.   Reports inability to tolerate the prazosin 1 mg last night, reports feeling drowsy today, states will not be compliant with this medication after discharge as she still had a nightmare, and feels dizzy.  We will discontinue for this reason.  Adding Inderal 10 mg twice daily for tachycardia.  We will need PCP follow-up after discharge.  Discussed discharge planning: Writer called pt's mother who states that pt is ready for discharge, states that pt's father works in [place] and will be picking  patient up after he gets off work at [employer] 10/21.  Patient's mother states that there are no guns at the home, and that safety planning has already been completed with CS[facility], and they know what to look out for which is indicative of a decline in mental health and patient.  She reports that knives and pills have been away locked from patient.  Patient's mother educated on the need to follow up with PCP regarding persistently elevated heart rate during hospitalization course, and verbalizes understanding.  Patient's mother also educated on the need for patient to be on birth control, since she is on Depakote, and educated on the negative impact of Depakote and the fetus, and verbalizes understanding.  Writer also educated mother on patient's medications; rationales, benefits, possible side effects, explained to mother.  Educated on the fact that prazosin will be discontinued, and Inderal was started for tachycardia while she awaits to see PCP.  Mother verbalized understanding and was appreciative the care pt is receiving, and had no further questions.    Total Time spent with patient: 45 minutes  Past Psychiatric History: Patient was hospitalized once for depression when she was about 14.  She also gives a history of ADHD, depression and anxiety.  Patient endorses history of PTSD with frequent flashbacks.  Is the patient at risk to self? Yes.    Has the patient been a risk to self in the past 6 months? Yes.    Has the patient been a risk to self within the distant past? Yes.    Is the patient a risk to others? Yes.    Has the patient been a risk  to others in the past 6 months? Yes.    Has the patient been a risk to others within the distant past? No.   [place] Scale:  Flowsheet Row Ad[facility] (Current) [facility] from 08/03/2023 in [facility] Department at [facility] ED from 05/15/2023 in [facility] Emergency Department at  [facility]  C-SSRS RISK CATEGORY Low Risk No Risk Moderate Risk        Prior Inpatient Therapy: Yes.   If yes, describe 1 hospitalization at age 52 Prior Outpatient Therapy: Yes.   If yes, describe recently started seeing a therapist and a psychiatrist.  Alcohol Screening: 1. How often do you have a drink containing alcohol?: Never 2. How many drinks containing alcohol do you have on a typical day when you are drinking?: 1 or 2 3. How often do you have six or more drinks on one occasion?: Never AUDIT-C Score: 0 4. How often during the last year have you found that you were not able to stop drinking once you had started?: Never 5. How often during the last year have you failed to do what was normally expected from you because of drinking?: Never 6. How often during the last year have you needed a first drink in the morning to get yourself going after a heavy drinking session?: Never 7. How often during the last year have you had a feeling of guilt of remorse after drinking?: Never 8. How often during the last year have you been unable to remember what happened the night before because you had been drinking?: Never 9. Have you or someone else been injured as a result of your drinking?: No 10. Has a relative or friend or a doctor or another health worker been concerned about your drinking or suggested you cut down?: No Alcohol Use Disorder Identification Test Final Score (AUDIT): 0 Alcohol Brief Interventions/Follow-up: Alcohol education/Brief advice Substance Abuse History in the last 12 months:  Yes.   Consequences of Substance Abuse: Patient uses marijuana on a daily basis.  May be contributing to her mood swings. Previous Psychotropic Medications: Yes  Psychological Evaluations: Yes  Past Medical History: History reviewed. No pertinent past medical history.  Past Surgical History:  Procedure Laterality Date   APPENDECTOMY     XI ROBOTIC LAPAROSCOPIC ASSISTED APPENDECTOMY N/A  11/19/2022   Procedure: XI ROBOTIC LAPAROSCOPIC ASSISTED APPENDECTOMY;  Surgeon: Carolan Shiver, MD;  Location: [facility] ORS;  Service: General;  Laterality: N/A;   Family History:  Family History  Problem Relation Age of Onset   Hypertension Mother    Depression Mother    Mental illness Mother    Mental illness Father    Mental illness Sister    Depression Sister    Mental illness Maternal Grandmother    Early death Maternal Grandmother    Depression Maternal Grandmother    Cancer Maternal Grandfather    Learning disabilities Paternal Grandfather    Family Psychiatric  History: Family history is positive for anxiety and depression. Tobacco Screening:  Social History   Tobacco Use  Smoking Status Never   Passive exposure: Never  Smokeless Tobacco Never    BH Tobacco Counseling     Are you interested in Tobacco Cessation Medications?  N/A, patient does not use tobacco products Counseled patient on smoking cessation:  N/A, patient does not use tobacco products Reason Tobacco Screening Not Completed: No value filed.       Social History:  Social History  Substance and Sexual Activity  Alcohol Use Not Currently     Social History   Substance and Sexual Activity  Drug Use Yes   Frequency: 7.0 times per week   Types: Marijuana   Comment: smokes every other day    Additional Social History: Marital status: Single What is your sexual orientation?: Not reported Has your sexual activity been affected by drugs, alcohol, medication, or emotional stress?: Not reported Does patient have children?: No                         Allergies:   Allergies  Allergen Reactions   Zoloft [Sertraline] Other (See Comments)    Does not work for pt   Lab Results:  Results for orders placed or performed during the hospital encounter of 08/04/23 (from the past 48 hour(s))  Valproic acid level     Status: None   Collection Time: 08/08/23  6:26 PM  Result Value Ref Range    Valproic Acid Lvl 67 50.0 - 100.0 ug/mL    Comment: Performed at [facility], [address], [place], [place] [address]     Blood Alcohol level:  Lab Results  Component Value Date   ETH <10 08/03/2023   ETH <10 05/15/2023    Metabolic Disorder Labs:  Lab Results  Component Value Date   HGBA1C 5.2 08/07/2023   MPG 102.54 08/07/2023   MPG 96.8 12/20/2020   Lab Results  Component Value Date   PROLACTIN 29.1 (H) 12/20/2020   Lab Results  Component Value Date   CHOL 142 05/22/2023   TRIG 43.0 05/22/2023   HDL 45.30 05/22/2023   CHOLHDL 3 05/22/2023   VLDL 8.6 05/22/2023   LDLCALC 88 05/22/2023   LDLCALC 82 12/20/2020    Current Medications: Current Facility-Administered Medications  Medication Dose Route Frequency Provider Last Rate Last Admin   acetaminophen (TYLENOL) tablet 650 mg  650 mg Oral Q6H PRN Massengill, Harrold Donath, MD       alum & mag hydroxide-simeth (MAALOX/MYLANTA) 200-200-20 MG/5ML suspension 30 mL  30 mL Oral Q4H PRN Massengill, Harrold Donath, MD       ARIPiprazole (ABILIFY) tablet 5 mg  5 mg Oral Daily Massengill, Nathan, MD   5 mg at 08/10/23 8295   diphenhydrAMINE (BENADRYL) capsule 50 mg  50 mg Oral TID PRN Phineas Inches, MD       Or   diphenhydrAMINE (BENADRYL) injection 50 mg  50 mg Intramuscular TID PRN Massengill, Harrold Donath, MD       divalproex (DEPAKOTE ER) 24 hr tablet 500 mg  500 mg Oral QHS Rex Kras, MD   500 mg at 08/09/23 2108   escitalopram (LEXAPRO) tablet 20 mg  20 mg Oral Daily Massengill, Harrold Donath, MD   20 mg at 08/10/23 6213   feeding supplement (ENSURE ENLIVE / ENSURE PLUS) liquid 237 mL  237 mL Oral TID BM Starleen Blue, NP   237 mL at 08/10/23 1446   haloperidol (HALDOL) tablet 5 mg  5 mg Oral TID PRN Phineas Inches, MD       Or   haloperidol lactate (HALDOL) injection 5 mg  5 mg Intramuscular TID PRN Massengill, Harrold Donath, MD       hydrOXYzine (ATARAX) tablet 25 mg  25 mg Oral TID PRN Phineas Inches, MD    25 mg at 08/09/23 1407   LORazepam (ATIVAN) tablet 2 mg  2 mg Oral TID PRN Phineas Inches, MD  Or   LORazepam (ATIVAN) injection 2 mg  2 mg Intramuscular TID PRN Massengill, Harrold Donath, MD       magnesium hydroxide (MILK OF MAGNESIA) suspension 30 mL  30 mL Oral Daily PRN Massengill, Harrold Donath, MD       melatonin tablet 5 mg  5 mg Oral QHS PRN Rex Kras, MD   5 mg at 08/09/23 2108   multivitamins with iron tablet 1 tablet  1 tablet Oral Daily Starleen Blue, NP   1 tablet at 08/10/23 0813   polyethylene glycol (MIRALAX / GLYCOLAX) packet 17 g  17 g Oral Once Starleen Blue, NP       polyethylene glycol (MIRALAX / GLYCOLAX) packet 17 g  17 g Oral Daily PRN Starleen Blue, NP       propranolol (INDERAL) tablet 10 mg  10 mg Oral BID Starleen Blue, NP       Vitamin D (Ergocalciferol) (DRISDOL) 1.25 MG (50000 UNIT) capsule 50,000 Units  50,000 Units Oral Q7 days Starleen Blue, NP   50,000 Units at 08/08/23 2142   PTA Medications: Medications Prior to Admission  Medication Sig Dispense Refill Last Dose   escitalopram (LEXAPRO) 20 MG tablet TAKE 1 TABLET(20 MG) BY MOUTH DAILY (Patient taking differently: Take 20 mg by mouth daily.) 30 tablet 0    hydrOXYzine (VISTARIL) 25 MG capsule Take 1 capsule (25 mg total) by mouth every 8 (eight) hours as needed. 30 capsule 0    Musculoskeletal: Strength & Muscle Tone: within normal limits Gait & Station: normal Patient leans: N/A  Psychiatric Specialty Exam:  Presentation  General Appearance:  Appropriate for Environment; Fairly Groomed  Eye Contact: Good  Speech: Clear and Coherent  Speech Volume: Normal  Handedness: Right   Mood and Affect  Mood: Euthymic  Affect: Congruent   Thought Process  Thought Processes: Coherent  Duration of Psychotic Symptoms:N/A Past Diagnosis of Schizophrenia or Psychoactive disorder: No  Descriptions of Associations:Intact  Orientation:Full (Time, Place and Person)  Thought  Content:Logical  Hallucinations:Hallucinations: None  Ideas of Reference:None  Suicidal Thoughts:Suicidal Thoughts: No  Homicidal Thoughts:Homicidal Thoughts: No   Sensorium  Memory: Immediate Good; Recent Good  Judgment: Good  Insight: Good   Executive Functions  Concentration: Good  Attention Span: Good  Recall: Good  Fund of Knowledge: Good  Language: Good   Psychomotor Activity  Psychomotor Activity: Psychomotor Activity: Normal   Assets  Assets: Communication Skills; Resilience   Sleep  Sleep: Sleep: Fair Number of Hours of Sleep: 4.75    Physical Exam: Physical Exam Constitutional:      Appearance: Normal appearance. She is normal weight.  HENT:     Head: Normocephalic.  Neurological:     General: No focal deficit present.     Mental Status: She is alert and oriented to person, place, and time. Mental status is at baseline.  Psychiatric:        Mood and Affect: Mood normal.        Behavior: Behavior normal.    Review of Systems  Constitutional: Negative.  Negative for fever.  HENT: Negative.  Negative for hearing loss.   Eyes: Negative.   Respiratory: Negative.  Negative for cough.   Cardiovascular: Negative.  Negative for chest pain.  Gastrointestinal:  Negative for heartburn.  Musculoskeletal: Negative.   Skin: Negative.   Neurological:  Negative for dizziness.  Psychiatric/Behavioral:  Positive for substance abuse. Negative for depression, hallucinations, memory loss and suicidal ideas. The patient is nervous/anxious. The patient does not have insomnia.  All other systems reviewed and are negative.  Blood pressure 99/74, pulse (!) 118, temperature 97.6 F (36.4 C), temperature source Oral, resp. rate 16, height 5\' 4"  (1.626 m), weight 54.6 kg, last menstrual period 08/03/2023, SpO2 100%. Body mass index is 20.67 kg/m.  Treatment Plan Summary:  Observation Level/Precautions:  15 minute checks  Laboratory: Labs  reviewed: Depakote level 67, Vit D 23.97  Psychotherapy: Individual and group.  Medications:  Start Inderal 10 mg BID for tachycardia Continue with Lexapro 20 mg a day Continue with Abilify 5 mg a day Melatonin 3 mg at night as needed Discontinue Minipress 1 mg at night Continue Depakote ER 500 mg -Continue Miralax daily PRN for constipation Discontinue Neosporin BID for self inflicted bite to L knee-healed  Consultations: None  Discharge Concerns: Unpredictable mood swings   Estimated LOS: Possibly by Monday.  Other:     Physician Treatment Plan for Primary Diagnosis: Bipolar II disorder (HCC) Long Term Goal(s): Improvement in symptoms so as ready for discharge  Short Term Goals: Ability to verbalize feelings will improve, Ability to disclose and discuss suicidal ideas, and Ability to demonstrate self-control will improve  Physician Treatment Plan for Secondary Diagnosis: Principal Problem:   Bipolar II disorder (HCC) Active Problems:   PTSD (post-traumatic stress disorder)  Long Term Goal(s): Improvement in symptoms so as ready for discharge  Short Term Goals: Ability to demonstrate self-control will improve, Ability to identify and develop effective coping behaviors will improve, Ability to maintain clinical measurements within normal limits will improve, and Compliance with prescribed medications will improve  I certify that inpatient services furnished can reasonably be expected to improve the patient's condition.    Starleen Blue, NP 10/20/20243:50 PM Patient ID: Donna Wagner, female   DOB: 2006/10/01, 17 y.o.   MRN: [id number]

## 2023-08-10 NOTE — [facility] Group Notes (Signed)
Pt did not attend goals group. 

## 2023-08-10 NOTE — [facility] Group Notes (Signed)
[facility] Group Notes:  (Nursing/MHT/Case Management/Adjunct)  Date:  08/10/2023  Time:  3:07 PM  Type of Therapy:  Psychoeducational Skills  Participation Level:  Active  Participation Quality:  Appropriate  Affect:  Appropriate  Cognitive:  Appropriate  Insight:  Appropriate  Engagement in Group:  Engaged  Modes of Intervention:  Discussion, Education, and Exploration  Summary of Progress/Problems: Education was given on positive mindset and how this impacts mental health. Patients were given positive affirmation quotes and then a podcast was played which discussed the impact of how the brain reacts during stress by Dr. Dahlia Client (neuroscientist.) The patient attended and was appropriate but left early.  Donna Wagner 08/10/2023, 3:07 PM

## 2023-08-10 NOTE — Progress Notes (Addendum)
Pt upset this evening over peer that she states told her that she reminds him of his ex-wife.  Peer was upset when Pt's boyfriend came to visit.  Pt states she had been transparent about having a boyfriend but peer had developed the wrong idea and is now upset with her.  This upset Pt.  Pt encouraged to focus on self and discharge tomorrow.  Pt reassured that the peer's trigger is not  her fault.  Pt stated that she will maintain a distance from peer until her discharge.      08/10/23 2215  Psych Admission Type (Psych Patients Only)  Admission Status Involuntary  Psychosocial Assessment  Patient Complaints Anxiety  Eye Contact Fair  Facial Expression Anxious  Affect Appropriate to circumstance  Speech Logical/coherent  Interaction Assertive  Motor Activity Fidgety  Appearance/Hygiene Unremarkable  Behavior Characteristics Appropriate to situation  Mood Pleasant  Thought Process  Coherency WDL  Content WDL  Delusions None reported or observed  Perception WDL  Hallucination None reported or observed  Judgment WDL  Confusion None  Danger to Self  Current suicidal ideation? Denies  Danger to Others  Danger to Others None reported or observed

## 2023-08-11 DIAGNOSIS — F332 Major depressive disorder, recurrent severe without psychotic features: Secondary | ICD-10-CM | POA: Diagnosis not present

## 2023-08-11 MED ORDER — MELATONIN 5 MG PO TABS: 5.0000 mg | ORAL_TABLET | Freq: Every evening | ORAL | 0 refills | Status: AC | PRN

## 2023-08-11 MED ORDER — PROPRANOLOL HCL 10 MG PO TABS: 10.0000 mg | ORAL_TABLET | Freq: Two times a day (BID) | ORAL | 0 refills | Status: DC

## 2023-08-11 MED ORDER — VITAMIN D (ERGOCALCIFEROL) 1.25 MG (50000 UNIT) PO CAPS: 50000.0000 [IU] | ORAL_CAPSULE | ORAL | 0 refills | Status: AC

## 2023-08-11 MED ORDER — DIVALPROEX SODIUM ER 500 MG PO TB24: 500.0000 mg | ORAL_TABLET | Freq: Every day | ORAL | 0 refills | Status: DC

## 2023-08-11 MED ORDER — ESCITALOPRAM OXALATE 20 MG PO TABS: 20.0000 mg | ORAL_TABLET | Freq: Every day | ORAL | 0 refills | Status: DC

## 2023-08-11 MED ORDER — TAB-A-VITE/IRON PO TABS: 1.0000 | ORAL_TABLET | Freq: Every day | ORAL | 0 refills | Status: AC

## 2023-08-11 MED ORDER — ARIPIPRAZOLE 5 MG PO TABS: 5.0000 mg | ORAL_TABLET | Freq: Every day | ORAL | 0 refills | Status: DC

## 2023-08-11 MED ORDER — HYDROXYZINE HCL 25 MG PO TABS: 25.0000 mg | ORAL_TABLET | Freq: Three times a day (TID) | ORAL | 0 refills | Status: DC | PRN

## 2023-08-11 NOTE — Progress Notes (Signed)
   08/11/23 0815  Psych Admission Type (Psych Patients Only)  Admission Status Involuntary  Psychosocial Assessment  Patient Complaints None  Eye Contact Fair  Facial Expression Anxious  Affect Appropriate to circumstance  Speech Logical/coherent  Interaction Assertive  Motor Activity Fidgety  Appearance/Hygiene Unremarkable  Behavior Characteristics Appropriate to situation  Mood Pleasant  Thought Process  Coherency WDL  Content WDL  Delusions None reported or observed  Perception WDL  Hallucination None reported or observed  Judgment WDL  Confusion None  Danger to Self  Current suicidal ideation? Denies  Danger to Others  Danger to Others None reported or observed

## 2023-08-11 NOTE — Plan of Care (Signed)
  Problem: Education: Goal: Knowledge of [facility] Education information/materials will improve Outcome: Progressing Goal: Emotional status will improve Outcome: Progressing Goal: Mental status will improve Outcome: Progressing Goal: Verbalization of understanding the information provided will improve Outcome: Progressing   Problem: Activity: Goal: Interest or engagement in activities will improve Outcome: Progressing Goal: Sleeping patterns will improve Outcome: Progressing   Problem: Coping: Goal: Ability to verbalize frustrations and anger appropriately will improve Outcome: Progressing Goal: Ability to demonstrate self-control will improve Outcome: Progressing   Problem: Health Behavior/Discharge Planning: Goal: Identification of resources available to assist in meeting health care needs will improve Outcome: Progressing Goal: Compliance with treatment plan for underlying cause of condition will improve Outcome: Progressing   Problem: Safety: Goal: Periods of time without injury will increase Outcome: Progressing       Problem: Safety: Goal: Periods of time without injury will increase Outcome: Progressing

## 2023-08-11 NOTE — Discharge Summary (Signed)
Physician Discharge Summary Note  Patient:  Donna Wagner is an 17 y.o., female MRN:  [id number] DOB:  [birth year] Patient phone:  [phone] (home)  Patient address:   [address] [place] [address],  Total Time spent with patient: 45 minutes  Date of Admission:  08/04/2023 Date of Discharge: 08/11/2023  Reason for Admission: The patient is a 17 year old Caucasian female with a past history of depression and PTSD who presented to the ED for evaluation for anxiety & anger outbursts.    Principal Problem: Bipolar II disorder [facility]) Discharge Diagnoses: Principal Problem:   Bipolar II disorder (HCC) Active Problems:   PTSD (post-traumatic stress disorder)   MDD (major depressive disorder), recurrent severe, without psychosis (HCC)  Past Psychiatric History: See H & P  Past Medical History: History reviewed. No pertinent past medical history.  Past Surgical History:  Procedure Laterality Date   APPENDECTOMY     XI ROBOTIC LAPAROSCOPIC ASSISTED APPENDECTOMY N/A 11/19/2022   Procedure: XI ROBOTIC LAPAROSCOPIC ASSISTED APPENDECTOMY;  Surgeon: Carolan Shiver, MD;  Location: [facility] ORS;  Service: General;  Laterality: N/A;   Family History:  Family History  Problem Relation Age of Onset   Hypertension Mother    Depression Mother    Mental illness Mother    Mental illness Father    Mental illness Sister    Depression Sister    Mental illness Maternal Grandmother    Early death Maternal Grandmother    Depression Maternal Grandmother    Cancer Maternal Grandfather    Learning disabilities Paternal Grandfather    Family Psychiatric  History: See H & P Social History:  Social History   Substance and Sexual Activity  Alcohol Use Not Currently     Social History   Substance and Sexual Activity  Drug Use Yes   Frequency: 7.0 times per week   Types: Marijuana   Comment: smokes every other day    Social History   Socioeconomic History   Marital status:  Single    Spouse name: Not on file   Number of children: Not on file   Years of education: Not on file   Highest education level: Not on file  Occupational History   Not on file  Tobacco Use   Smoking status: Never    Passive exposure: Never   Smokeless tobacco: Never  Vaping Use   Vaping status: Every Day  Substance and Sexual Activity   Alcohol use: Not Currently   Drug use: Yes    Frequency: 7.0 times per week    Types: Marijuana    Comment: smokes every other day   Sexual activity: Yes    Partners: Male  Other Topics Concern   Not on file  Social History Narrative   Not on file   Social Determinants of Health   Financial Resource Strain: Not on file  Food Insecurity: No Food Insecurity (08/04/2023)   Hunger Vital Sign    Worried About Running Out of Food in the Last Year: Never true    Ran Out of Food in the Last Year: Never true  Transportation Needs: No Transportation Needs (08/04/2023)   PRAPARE - Administrator, Civil Service (Medical): No    Lack of Transportation (Non-Medical): No  Physical Activity: Not on file  Stress: Not on file  Social Connections: Not on file   Hospital Course:   During the patient's hospitalization, patient had extensive initial psychiatric evaluation, and follow-up psychiatric evaluations every day. Psychiatric diagnoses provided upon  initial assessment: MDD, PTSD. Patient's psychiatric medications were adjusted on admission:  Continue with Lexapro 20 mg a day Continue with Abilify 5 mg a day Melatonin 3 mg at night as needed Consider Minipress 1 mg at night Consider Depakote to 50 mg at night to be titrated gradually.   During the hospitalization, other adjustments were made to the patient's psychiatric medication regimen. Medications at discharge are as follows: -Continue Inderal 10 mg BID for tachycardia-Educated on the need to f/u with PCP & cardiology regarding tachycardia -Continue with Lexapro 20 mg a day for  depressive symptoms  -Continue with Abilify 5 mg a day for mood stabilization -Melatonin 3 mg at night as needed for sleep -Continue Hydroxyzine 25 mg TID PRN for anxiety -Continue Vitamin D 50.000 units weekly for low vitamin D levels -Continue Depakote ER 500 mg for mood stabilization-educated pt and mother on importance of being on birth control while taking this med d/t risk of fetal anomalies if taken while pregnant. Decline birth control over the course of this hospitalization, states prefers IUD-Educated to make appt with GYN after discharge to get the IUD and both pt and mother verbalized understanding.   Patient's care was discussed during the interdisciplinary team meeting every day during the hospitalization. The patient denies having side effects to prescribed psychiatric medication. Gradually, patient started adjusting to milieu. The patient was evaluated each day by a clinical provider to ascertain response to treatment. Improvement was noted by the patient's report of decreasing symptoms, improved sleep and appetite, affect, medication tolerance, behavior, and participation in unit programming.  Patient was asked each day to complete a self inventory noting mood, mental status, pain, new symptoms, anxiety and concerns.     Symptoms were reported as significantly decreased or resolved completely by discharge. On day of discharge, the patient reports that their mood is stable. The patient denied having suicidal thoughts for more than 48 hours prior to discharge.  Patient denies having homicidal thoughts.  Patient denies having auditory hallucinations.  Patient denies any visual hallucinations or other symptoms of psychosis. The patient was motivated to continue taking medication with a goal of continued improvement in mental health.    The patient reports their target psychiatric symptoms of depression, anxiety, impulsivity and insomnia responded well to the psychiatric medications, and the  patient reports overall benefit from this psychiatric hospitalization. Supportive psychotherapy was provided to the patient. The patient also participated in regular group therapy while hospitalized. Coping skills, problem solving as well as relaxation therapies were also part of the unit programming.   Labs were reviewed with the patient, and abnormal results were discussed with the patient. Valproic acid level on 10/18-WNL at 67, hemoglobin A1c is WNL at 5.2, BMP is WNL, urine pregnancy test negative, CBC WNL.  QTc is 458.   The patient is able to verbalize their individual safety plan to this provider.   # It is recommended to the patient to continue psychiatric medications as prescribed, after discharge from the hospital.     # It is recommended to the patient to follow up with your outpatient psychiatric provider and PCP.   # It was discussed with the patient, the impact of alcohol, drugs, tobacco have been there overall psychiatric and medical wellbeing, and total abstinence from substance use was recommended the patient.ed.   # Prescriptions provided or sent directly to preferred pharmacy at discharge. Patient agreeable to plan. Given opportunity to ask questions. Appears to feel comfortable with discharge.    #  In the event of worsening symptoms, the patient is instructed to call the crisis hotline (988), 911 and or go to the nearest ED for appropriate evaluation and treatment of symptoms. To follow-up with primary care provider for other medical issues, concerns and or health care needs   # Patient was discharged home to her parents' home with a plan to follow up as noted below.    Total Time spent with patient: 45 minutes  Physical Findings: AIMS:0 CIWA: n/a COWS:  n/a  Musculoskeletal: Strength & Muscle Tone: within normal limits Gait & Station: normal Patient leans: N/A  Psychiatric Specialty Exam:  Presentation  General Appearance:  Appropriate for Environment; Well  Groomed  Eye Contact: Fair  Speech: Clear and Coherent  Speech Volume: Normal  Handedness: Right   Mood and Affect  Mood: Euthymic  Affect: Congruent   Thought Process  Thought Processes: Coherent  Descriptions of Associations:Intact  Orientation:Full (Time, Place and Person)  Thought Content:Logical  History of Schizophrenia/Schizoaffective disorder:No  Duration of Psychotic Symptoms:No data recorded Hallucinations:Hallucinations: None  Ideas of Reference:None  Suicidal Thoughts:Suicidal Thoughts: No  Homicidal Thoughts:Homicidal Thoughts: No   Sensorium  Memory: Immediate Good  Judgment: Good  Insight: Good   Executive Functions  Concentration: Good  Attention Span: Good  Recall: Good  Fund of Knowledge: Good  Language: Good   Psychomotor Activity  Psychomotor Activity: Psychomotor Activity: Normal   Assets  Assets: Communication Skills   Sleep  Sleep: Sleep: Good    Physical Exam: Physical Exam Constitutional:      Appearance: Normal appearance.  HENT:     Head: Normocephalic.  Eyes:     Pupils: Pupils are equal, round, and reactive to light.  Pulmonary:     Effort: Pulmonary effort is normal.  Musculoskeletal:        General: Normal range of motion.     Cervical back: Normal range of motion.  Neurological:     General: No focal deficit present.     Mental Status: She is alert and oriented to person, place, and time.    Review of Systems  Constitutional: Negative.   HENT: Negative.    Eyes: Negative.   Respiratory: Negative.    Cardiovascular: Negative.   Gastrointestinal: Negative.   Genitourinary: Negative.   Musculoskeletal: Negative.   Skin: Negative.   Neurological: Negative.  Negative for dizziness.  Psychiatric/Behavioral:  Positive for depression (Denies SI, denies HI, denies plan or intent) and substance abuse. Negative for hallucinations, memory loss and suicidal ideas. The patient is  nervous/anxious (resolving) and has insomnia (Resolving).    Blood pressure 109/80, pulse (!) 110, temperature 97.8 F (36.6 C), temperature source Oral, resp. rate 16, height 5\' 4"  (1.626 m), weight 54.6 kg, last menstrual period 08/03/2023, SpO2 100%. Body mass index is 20.67 kg/m.   Social History   Tobacco Use  Smoking Status Never   Passive exposure: Never  Smokeless Tobacco Never   Tobacco Cessation:  N/A, patient does not currently use tobacco products   Blood Alcohol level:  Lab Results  Component Value Date   ETH <10 08/03/2023   ETH <10 05/15/2023    Metabolic Disorder Labs:  Lab Results  Component Value Date   HGBA1C 5.2 08/07/2023   MPG 102.54 08/07/2023   MPG 96.8 12/20/2020   Lab Results  Component Value Date   PROLACTIN 29.1 (H) 12/20/2020   Lab Results  Component Value Date   CHOL 142 05/22/2023   TRIG 43.0 05/22/2023   HDL 45.30  05/22/2023   CHOLHDL 3 05/22/2023   VLDL 8.6 05/22/2023   LDLCALC 88 05/22/2023   LDLCALC 82 12/20/2020    See Psychiatric Specialty Exam and Suicide Risk Assessment completed by Attending Physician prior to discharge.  Discharge destination:  Home  Is patient on multiple antipsychotic therapies at discharge:  No   Has Patient had three or more failed trials of antipsychotic monotherapy by history:  No  Recommended Plan for Multiple Antipsychotic Therapies: NA   Allergies as of 08/11/2023       Reactions   Zoloft [sertraline] Other (See Comments)   Does not work for pt        Medication List     STOP taking these medications    hydrOXYzine 25 MG capsule Commonly known as: VISTARIL Replaced by: hydrOXYzine 25 MG tablet       TAKE these medications      Indication  ARIPiprazole 5 MG tablet Commonly known as: ABILIFY Take 1 tablet (5 mg total) by mouth daily. Start taking on: August 12, 2023  Indication: mood stabilization   divalproex 500 MG 24 hr tablet Commonly known as: DEPAKOTE  ER Take 1 tablet (500 mg total) by mouth at bedtime.  Indication: mood stabilization   escitalopram 20 MG tablet Commonly known as: LEXAPRO Take 1 tablet (20 mg total) by mouth daily. Start taking on: August 12, 2023 What changed: See the new instructions.  Indication: Major Depressive Disorder   hydrOXYzine 25 MG tablet Commonly known as: ATARAX Take 1 tablet (25 mg total) by mouth 3 (three) times daily as needed for anxiety. Replaces: hydrOXYzine 25 MG capsule  Indication: Feeling Anxious   melatonin 5 MG Tabs Take 1 tablet (5 mg total) by mouth at bedtime as needed.  Indication: Trouble Sleeping   multivitamins with iron Tabs tablet Take 1 tablet by mouth daily. Start taking on: August 12, 2023  Indication: Nutritional Support   propranolol 10 MG tablet Commonly known as: INDERAL Take 1 tablet (10 mg total) by mouth 2 (two) times daily.  Indication: tachycardia   Vitamin D (Ergocalciferol) 1.25 MG (50000 UNIT) Caps capsule Commonly known as: DRISDOL Take 1 capsule (50,000 Units total) by mouth every 7 (seven) days. Start taking on: August 15, 2023  Indication: Vitamin D Deficiency        Follow-up Information     [facility] Go on 08/13/2023.   Why: You have an appointment with Kandyce Rud for therapy services on 08/13/23 at 12:00 pm, in person. Contact information: [address] [place] [address] [phone]         Kara Dies, NP. Go on 08/21/2023.   Specialties: Nurse Practitioner, Family Medicine Why: You have an appointment for medication management services on 08/21/23 at 11:40 am, in person. Contact information: [address] Darcel [address] [place] [address] [phone]                Signed: Starleen Blue, NP 08/11/2023, 1:38 PM

## 2023-08-11 NOTE — [facility] Suicide Risk Assessment (Addendum)
Suicide Risk Assessment  Discharge Assessment    [facility] Discharge Suicide Risk Assessment   Principal Problem: Bipolar II disorder [facility]) Discharge Diagnoses: Principal Problem:   Bipolar II disorder (HCC) Active Problems:   PTSD (post-traumatic stress disorder)   MDD (major depressive disorder), recurrent severe, without psychosis (HCC)  Reason for admission   The patient is a 17 year old Caucasian female with a past history of depression and PTSD who presented to the ED for evaluation for anxiety & anger outbursts.  During the patient's hospitalization, patient had extensive initial psychiatric evaluation, and follow-up psychiatric evaluations every day. Psychiatric diagnoses provided upon initial assessment: MDD, PTSD. Patient's psychiatric medications were adjusted on admission:  Continue with Lexapro 20 mg a day Continue with Abilify 5 mg a day Melatonin 3 mg at night as needed Consider Minipress 1 mg at night Consider Depakote to 50 mg at night to be titrated gradually.  During the hospitalization, other adjustments were made to the patient's psychiatric medication regimen. Medications at discharge are as follows: -Continue Inderal 10 mg BID for tachycardia-Educated on the need to f/u with PCP & cardiology regarding tachycardia -Continue with Lexapro 20 mg a day for depressive symptoms  -Continue with Abilify 5 mg a day for mood stabilization -Melatonin 3 mg at night as needed for sleep -Continue Hydroxyzine 25 mg TID PRN for anxiety -Continue Vitamin D 50.000 units weekly for low vitamin D levels -Continue Depakote ER 500 mg for mood stabilization-educated pt and mother on importance of being on birth control while taking this med d/t risk of fetal anomalies if taken while pregnant. Decline birth control over the course of this hospitalization, states prefers IUD-Educated to make appt with GYN after discharge to get the IUD and both pt and mother verbalized  understanding.  Patient's care was discussed during the interdisciplinary team meeting every day during the hospitalization. The patient denies having side effects to prescribed psychiatric medication. Gradually, patient started adjusting to milieu. The patient was evaluated each day by a clinical provider to ascertain response to treatment. Improvement was noted by the patient's report of decreasing symptoms, improved sleep and appetite, affect, medication tolerance, behavior, and participation in unit programming.  Patient was asked each day to complete a self inventory noting mood, mental status, pain, new symptoms, anxiety and concerns.    Symptoms were reported as significantly decreased or resolved completely by discharge. On day of discharge, the patient reports that their mood is stable. The patient denied having suicidal thoughts for more than 48 hours prior to discharge.  Patient denies having homicidal thoughts.  Patient denies having auditory hallucinations.  Patient denies any visual hallucinations or other symptoms of psychosis. The patient was motivated to continue taking medication with a goal of continued improvement in mental health.   The patient reports their target psychiatric symptoms of depression, anxiety, impulsivity and insomnia responded well to the psychiatric medications, and the patient reports overall benefit from this psychiatric hospitalization. Supportive psychotherapy was provided to the patient. The patient also participated in regular group therapy while hospitalized. Coping skills, problem solving as well as relaxation therapies were also part of the unit programming.  Labs were reviewed with the patient, and abnormal results were discussed with the patient. Valproic acid level on 10/18-WNL at 67, hemoglobin A1c is WNL at 5.2, BMP is WNL, urine pregnancy test negative, CBC WNL.  QTc is 458.  The patient is able to verbalize their individual safety plan to this  provider.  # It is recommended to  the patient to continue psychiatric medications as prescribed, after discharge from the hospital.    # It is recommended to the patient to follow up with your outpatient psychiatric provider and PCP.  # It was discussed with the patient, the impact of alcohol, drugs, tobacco have been there overall psychiatric and medical wellbeing, and total abstinence from substance use was recommended the patient.ed.  # Prescriptions provided or sent directly to preferred pharmacy at discharge. Patient agreeable to plan. Given opportunity to ask questions. Appears to feel comfortable with discharge.    # In the event of worsening symptoms, the patient is instructed to call the crisis hotline (988), 911 and or go to the nearest ED for appropriate evaluation and treatment of symptoms. To follow-up with primary care provider for other medical issues, concerns and or health care needs  # Patient was discharged home to her parents' home with a plan to follow up as noted below.   Total Time spent with patient: 45 minutes  Musculoskeletal: Strength & Muscle Tone: within normal limits Gait & Station: normal Patient leans: N/A  Psychiatric Specialty Exam  Presentation  General Appearance:  Appropriate for Environment; Well Groomed  Eye Contact: Fair  Speech: Clear and Coherent  Speech Volume: Normal  Handedness: Right   Mood and Affect  Mood: Euthymic  Duration of Depression Symptoms: Greater than two weeks  Affect: Congruent   Thought Process  Thought Processes: Coherent  Descriptions of Associations:Intact  Orientation:Full (Time, Place and Person)  Thought Content:Logical  History of Schizophrenia/Schizoaffective disorder:No  Duration of Psychotic Symptoms:No data recorded Hallucinations:Hallucinations: None  Ideas of Reference:None  Suicidal Thoughts:Suicidal Thoughts: No  Homicidal Thoughts:Homicidal Thoughts: No   Sensorium   Memory: Immediate Good  Judgment: Good  Insight: Good   Executive Functions  Concentration: Good  Attention Span: Good  Recall: Good  Fund of Knowledge: Good  Language: Good   Psychomotor Activity  Psychomotor Activity: Psychomotor Activity: Normal   Assets  Assets: Communication Skills   Sleep  Sleep: Sleep: Good   Physical Exam: Physical Exam Constitutional:      Appearance: Normal appearance.  HENT:     Head: Normocephalic.     Nose: No congestion.  Eyes:     Pupils: Pupils are equal, round, and reactive to light.  Musculoskeletal:        General: Normal range of motion.     Cervical back: Normal range of motion.  Neurological:     General: No focal deficit present.     Mental Status: She is alert and oriented to person, place, and time.    Review of Systems  Constitutional: Negative.   HENT: Negative.    Eyes: Negative.   Respiratory: Negative.    Cardiovascular: Negative.  Negative for chest pain.  Gastrointestinal: Negative.  Negative for heartburn.  Genitourinary: Negative.   Musculoskeletal: Negative.   Skin: Negative.   Neurological: Negative.  Negative for dizziness.  Psychiatric/Behavioral:  Positive for depression (Denies SI/HI, denies any intent or plan to harm self or any one else in the community) and substance abuse (Educated on the need to abstain from substance use and on the negative impacts on her mental health). Negative for hallucinations, memory loss and suicidal ideas. The patient is nervous/anxious (Resolving on current meds) and has insomnia (Resolving on current meds).    Blood pressure 109/80, pulse (!) 110, temperature 97.8 F (36.6 C), temperature source Oral, resp. rate 16, height 5\' 4"  (1.626 m), weight 54.6 kg, last menstrual period 08/03/2023,  SpO2 100%. Body mass index is 20.67 kg/m.  Mental Status Per Nursing Assessment::   On Admission:  Self-harm behaviors  Demographic Factors:  Adolescent or young  adult, Caucasian, and Unemployed  Loss Factors: NA  Historical Factors: Impulsivity  Risk Reduction Factors:   Sense of responsibility to family, Living with another person, especially a relative, Positive social support, and Positive therapeutic relationship  Continued Clinical Symptoms:  Previous Psychiatric Diagnoses and Treatments  Cognitive Features That Contribute To Risk:  None    Suicide Risk:  Mild:  There are no identifiable suicide plans, no associated intent, mild dysphoria and related symptoms, good self-control (both objective and subjective assessment), few other risk factors, and identifiable protective factors, including available and accessible social support.    Follow-up Information     [facility]. Go on 08/13/2023.   Why: You have an appointment with Kandyce Rud for therapy services on 08/13/23 at 12:00 pm, in person. Contact information: [address] [place] [address] [phone]         Kara Dies, NP. Go on 08/21/2023.   Specialties: Nurse Practitioner, Family Medicine Why: You have an appointment for medication management services on 08/21/23 at 11:40 am, in person. Contact information: [address] [place] [address] [phone]                Starleen Blue, NP 08/11/2023, 1:34 PM

## 2023-08-11 NOTE — Progress Notes (Signed)
  [facility] Adult Case Management Discharge Plan :  Will you be returning to the same living situation after discharge:  Yes,  pt will be returning home with mom and dad. At discharge, do you have transportation home?: Yes,  pt is being picked up by dad at 3:00pm. Do you have the ability to pay for your medications: Yes,  Pt has [employer] and is employed.  Release of information consent forms completed and in the chart;  Patient's signature needed at discharge.  Patient to Follow up at:  Follow-up Information     [facility]. Go on 08/13/2023.   Why: You have an appointment with Kandyce Rud for therapy services on 08/13/23 at 12:00 pm, in person. Contact information: [address] [place] [address] [phone]         Kara Dies, NP. Go on 08/21/2023.   Specialties: Nurse Practitioner, Family Medicine Why: You have an appointment for medication management services on 08/21/23 at 11:40 am, in person. Contact information: [address] [place] [address] [phone]                 Next level of care provider has access to [facility] Link:no  Safety Planning and Suicide Prevention discussed: Yes,  Amy Kenna (mom) [phone]     Has patient been referred to the Quitline?: Patient does not use tobacco/nicotine products  Patient has been referred for addiction treatment: Patient refused referral for treatment. Pt smokes marijuana and states she has the resources she needs.   Kathi Der, LCSWA 08/11/2023, 9:34 AM

## 2023-08-11 NOTE — Progress Notes (Signed)
   08/11/23 0534  15 Minute Checks  Location Bedroom  Visual Appearance Calm  Behavior Sleeping  Sleep (Behavioral Health Patients Only)  Calculate sleep? (Click Yes once per 24 hr at 0600 safety check) Yes  Documented sleep last 24 hours 5.5

## 2023-08-11 NOTE — Group Note (Signed)
Recreation Therapy Group Note   Group Topic:Stress Management  Group Date: 08/11/2023 Start Time: 0935 End Time: 1000 Facilitators: Juliona Vales-McCall, LRT,CTRS Location: [address] Dayroom   Group Topic: Stress Management   Goal Area(s) Addresses:  Patient will actively participate in stress management techniques presented during session.  Patient will successfully identify benefit of practicing stress management post d/c.   Intervention: Relaxation exercise with ambient sound and script   Group Description: Guided Imagery. LRT provided education, instruction, and demonstration on practice of visualization via guided imagery. Patient was asked to participate in the technique introduced during session. LRT debriefed including topics of mindfulness, stress management and specific scenarios each patient could use these techniques. Patients were given suggestions of ways to access scripts post d/c and encouraged to explore Youtube and other apps available on smartphones, tablets, and computers.  Education:  Stress Management, Discharge Planning.   Education Outcome: Acknowledges education   Affect/Mood: N/A   Participation Level: Did not attend    Clinical Observations/Individualized Feedback:     Plan: Continue to engage patient in RT group sessions 2-3x/week.   Jalissa Heinzelman-McCall, LRT,CTRS 08/11/2023 1:14 PM

## 2023-08-11 NOTE — Progress Notes (Signed)
Patient discharged from [facility] on 08/11/23. Patient denies SI, plan, and intention. Suicide safety plan completed, reviewed with this RN, given to the patient, and a copy in the chart. Patient denies HI/AVH upon discharge. Patient is alert, oriented, and cooperative. RN provided patient with discharge paperwork and reviewed information with patient. Patient expressed that she understood all of the discharge instructions. Pt was satisfied with belongings returned to her from the locker and at bedside.

## 2023-08-11 NOTE — Group Note (Signed)
Date:  08/11/2023 Time:  9:51 AM  Group Topic/Focus:  Goals Group:   The focus of this group is to help patients establish daily goals to achieve during treatment and discuss how the patient can incorporate goal setting into their daily lives to aide in recovery.    Participation Level:  Active  Participation Quality:  Appropriate  Affect:  Appropriate  Cognitive:  Appropriate  Insight: Appropriate  Engagement in Group:  Engaged  Modes of Intervention:  Discussion  Additional Comments:     [name] 08/11/2023, 9:51 AM

## 2023-08-21 ENCOUNTER — Ambulatory Visit: Payer: Self-pay | Admitting: Nurse Practitioner

## 2023-08-22 ENCOUNTER — Ambulatory Visit (INDEPENDENT_AMBULATORY_CARE_PROVIDER_SITE_OTHER): Payer: 59 | Admitting: Nurse Practitioner

## 2023-08-22 ENCOUNTER — Encounter: Payer: Self-pay | Admitting: Nurse Practitioner

## 2023-08-22 VITALS — BP 90/70 | HR 70 | Temp 97.9°F | Ht 64.0 in | Wt 119.2 lb

## 2023-08-22 DIAGNOSIS — F3181 Bipolar II disorder: Secondary | ICD-10-CM | POA: Diagnosis not present

## 2023-08-22 DIAGNOSIS — E559 Vitamin D deficiency, unspecified: Secondary | ICD-10-CM | POA: Insufficient documentation

## 2023-08-22 DIAGNOSIS — F419 Anxiety disorder, unspecified: Secondary | ICD-10-CM

## 2023-08-22 DIAGNOSIS — F4310 Post-traumatic stress disorder, unspecified: Secondary | ICD-10-CM

## 2023-08-22 MED ORDER — ESCITALOPRAM OXALATE 20 MG PO TABS: 20.0000 mg | ORAL_TABLET | Freq: Every day | ORAL | 0 refills | Status: DC

## 2023-08-22 MED ORDER — DIVALPROEX SODIUM ER 500 MG PO TB24: 500.0000 mg | ORAL_TABLET | Freq: Every day | ORAL | 0 refills | Status: DC

## 2023-08-22 MED ORDER — ARIPIPRAZOLE 5 MG PO TABS: 5.0000 mg | ORAL_TABLET | Freq: Every day | ORAL | 0 refills | Status: DC

## 2023-08-22 NOTE — Progress Notes (Unsigned)
Established Patient Office Visit  Subjective:  Patient ID: Donna Wagner, female    DOB: 2006-06-28  Age: 17 y.o. MRN: [id number]  CC:  Chief Complaint  Patient presents with   Medical Management of Chronic Issues    Medication refills     HPI  Donna Wagner presents for refill of medicatiuon.  She has history of depression, anxiety and PTSD.  She was admitted due to suicidal ideation and anger and anxiety outburst on 08/04/2023 was discharged on 08/11/2023.   She states her mood is stable, she feels better and has no sucidal ideation.  Patient states that she is not able to stay a sleep. Sleep for 3-4 hours each night. She feels tired during the day time and more sleepy.  She has been taking Abilify in the morning.   Pt states that she is running out of medication and need refill.  Has been diagnosed with bipolar disorder stay in the hospital and has been discharged on medication for mood control Abilify and  Depakote.  She has not yet established with psychiatry.   HPI   History reviewed. No pertinent past medical history.  Past Surgical History:  Procedure Laterality Date   APPENDECTOMY     XI ROBOTIC LAPAROSCOPIC ASSISTED APPENDECTOMY N/A 11/19/2022   Procedure: XI ROBOTIC LAPAROSCOPIC ASSISTED APPENDECTOMY;  Surgeon: Carolan Shiver, MD;  Location: [facility] ORS;  Service: General;  Laterality: N/A;    Family History  Problem Relation Age of Onset   Hypertension Mother    Depression Mother    Mental illness Mother    Mental illness Father    Mental illness Sister    Depression Sister    Mental illness Maternal Grandmother    Early death Maternal Grandmother    Depression Maternal Grandmother    Cancer Maternal Grandfather    Learning disabilities Paternal Grandfather     Social History   Socioeconomic History   Marital status: Single    Spouse name: Not on file   Number of children: Not on file   Years of education: Not on file   Highest  education level: Not on file  Occupational History   Not on file  Tobacco Use   Smoking status: Never    Passive exposure: Never   Smokeless tobacco: Never  Vaping Use   Vaping status: Every Day  Substance and Sexual Activity   Alcohol use: Not Currently   Drug use: Yes    Frequency: 7.0 times per week    Types: Marijuana    Comment: smokes every other day   Sexual activity: Yes    Partners: Male  Other Topics Concern   Not on file  Social History Narrative   Not on file   Social Determinants of Health   Financial Resource Strain: Not on file  Food Insecurity: No Food Insecurity (08/04/2023)   Hunger Vital Sign    Worried About Running Out of Food in the Last Year: Never true    Ran Out of Food in the Last Year: Never true  Transportation Needs: No Transportation Needs (08/04/2023)   PRAPARE - Administrator, Civil Service (Medical): No    Lack of Transportation (Non-Medical): No  Physical Activity: Not on file  Stress: Not on file  Social Connections: Not on file  Intimate Partner Violence: Not At Risk (08/04/2023)   Humiliation, Afraid, Rape, and Kick questionnaire    Fear of Current or Ex-Partner: No    Emotionally Abused: No  Physically Abused: No    Sexually Abused: No     Outpatient Medications Prior to Visit  Medication Sig Dispense Refill   hydrOXYzine (ATARAX) 25 MG tablet Take 1 tablet (25 mg total) by mouth 3 (three) times daily as needed for anxiety. 30 tablet 0   melatonin 5 MG TABS Take 1 tablet (5 mg total) by mouth at bedtime as needed. 30 tablet 0   Multiple Vitamins-Iron (MULTIVITAMINS WITH IRON) TABS tablet Take 1 tablet by mouth daily. 30 tablet 0   propranolol (INDERAL) 10 MG tablet Take 1 tablet (10 mg total) by mouth 2 (two) times daily. 60 tablet 0   Vitamin D, Ergocalciferol, (DRISDOL) 1.25 MG (50000 UNIT) CAPS capsule Take 1 capsule (50,000 Units total) by mouth every 7 (seven) days. 5 capsule 0   ARIPiprazole (ABILIFY) 5 MG  tablet Take 1 tablet (5 mg total) by mouth daily. 30 tablet 0   divalproex (DEPAKOTE ER) 500 MG 24 hr tablet Take 1 tablet (500 mg total) by mouth at bedtime. 30 tablet 0   escitalopram (LEXAPRO) 20 MG tablet Take 1 tablet (20 mg total) by mouth daily. 30 tablet 0   No facility-administered medications prior to visit.    Allergies  Allergen Reactions   Zoloft [Sertraline] Other (See Comments)    Does not work for pt    ROS Review of Systems Negative unless indicated in HPI.    Objective:    Physical Exam  BP 90/70   Pulse 70   Temp 97.9 F (36.6 C) (Oral)   Ht 5\' 4"  (1.626 m)   Wt 119 lb 3.2 oz (54.1 kg)   LMP 08/03/2023 (Exact Date)   SpO2 99%   BMI 20.46 kg/m  Wt Readings from Last 3 Encounters:  08/22/23 119 lb 3.2 oz (54.1 kg) (33%, Z= -0.44)*  08/04/23 120 lb 6.4 oz (54.6 kg) (35%, Z= -0.37)*  08/03/23 120 lb (54.4 kg) (35%, Z= -0.40)*   * Growth percentiles are based on CDC (Girls, 2-20 Years) data.     Health Maintenance  Topic Date Due   CHLAMYDIA SCREENING  Never done   HPV VACCINES (1 - 3-dose series) Never done   Hepatitis C Screening  Never done   DTaP/Tdap/Td (1 - Tdap) Never done   COVID-19 Vaccine (1 - 2023-24 season) 09/06/2023 (Originally 06/22/2023)   INFLUENZA VACCINE  01/19/2024 (Originally 05/22/2023)   HIV Screening  Completed       Topic Date Due   HPV VACCINES (1 - 3-dose series) Never done    Lab Results  Component Value Date   TSH 1.90 05/22/2023   Lab Results  Component Value Date   WBC 5.8 08/03/2023   HGB 12.9 08/03/2023   HCT 37.3 08/03/2023   MCV 88.8 08/03/2023   PLT 272 08/03/2023   Lab Results  Component Value Date   NA 137 08/07/2023   K 4.1 08/07/2023   CO2 25 08/07/2023   GLUCOSE 87 08/07/2023   BUN 9 08/07/2023   CREATININE 0.61 08/07/2023   BILITOT 0.7 08/03/2023   ALKPHOS 78 08/03/2023   AST 17 08/03/2023   ALT 12 08/03/2023   PROT 7.5 08/03/2023   ALBUMIN 4.1 08/03/2023   CALCIUM 9.3 08/07/2023    ANIONGAP 10 08/07/2023   GFR 127.20 05/22/2023   Lab Results  Component Value Date   CHOL 142 05/22/2023   Lab Results  Component Value Date   HDL 45.30 05/22/2023   Lab Results  Component Value Date  LDLCALC 88 05/22/2023   Lab Results  Component Value Date   TRIG 43.0 05/22/2023   Lab Results  Component Value Date   CHOLHDL 3 05/22/2023   Lab Results  Component Value Date   HGBA1C 5.2 08/07/2023      Assessment & Plan:  Vitamin D deficiency Assessment & Plan: Continue to take vitamin D3 supplement once weekly. Will continue to monitor   Bipolar II disorder [facility]) Assessment & Plan: We will refill medication until patient establish with psychiatry. Refill the Depakote and Abilify. Advised patient to take Abilify during the nighttime. Referral sent for psychiatry. GYN referral in place contact details provided for GYN, advised patient to call and schedule appointment to discuss contraception options.  Orders: -     Ambulatory referral to Psychiatry  Anxiety and depression Assessment & Plan:    08/22/2023    9:56 AM 05/15/2023    2:57 PM  GAD 7 : Generalized Anxiety Score  Nervous, Anxious, on Edge 2 1  Control/stop worrying 3 1  Worry too much - different things 3 1  Trouble relaxing 3 2  Restless 3 2  Easily annoyed or irritable 3 3  Afraid - awful might happen 2 1  Total GAD 7 Score 19 11  Anxiety Difficulty Very difficult Somewhat difficult  Continue Lexapro 20 mg daily.   Referral sent to psychiatry.      Orders: -     Ambulatory referral to Psychiatry  Other orders -     ARIPiprazole; Take 1 tablet (5 mg total) by mouth daily.  Dispense: 30 tablet; Refill: 0 -     Divalproex Sodium ER; Take 1 tablet (500 mg total) by mouth at bedtime.  Dispense: 30 tablet; Refill: 0 -     Escitalopram Oxalate; Take 1 tablet (20 mg total) by mouth daily.  Dispense: 90 tablet; Refill: 0    Follow-up: Return in about 6 months (around 02/19/2024).    Kara Dies, NP

## 2023-08-22 NOTE — Patient Instructions (Addendum)
Please call [facility] [phone] to make an appointment. Call the GYN to schedule an appointment Take Abilify at bed time.

## 2023-08-23 NOTE — Assessment & Plan Note (Signed)
Continue to take vitamin D3 supplement once weekly. Will continue to monitor

## 2023-08-23 NOTE — Assessment & Plan Note (Addendum)
We will refill medication until patient establish with psychiatry. Refill the Depakote and Abilify. Advised patient to take Abilify during the nighttime. Referral sent for psychiatry. GYN referral in place contact details provided for GYN, advised patient to call and schedule appointment to discuss contraception options.

## 2023-08-23 NOTE — Assessment & Plan Note (Signed)
    08/22/2023    9:56 AM 05/15/2023    2:57 PM  GAD 7 : Generalized Anxiety Score  Nervous, Anxious, on Edge 2 1  Control/stop worrying 3 1  Worry too much - different things 3 1  Trouble relaxing 3 2  Restless 3 2  Easily annoyed or irritable 3 3  Afraid - awful might happen 2 1  Total GAD 7 Score 19 11  Anxiety Difficulty Very difficult Somewhat difficult  Continue Lexapro 20 mg daily.   Referral sent to psychiatry.

## 2023-09-03 ENCOUNTER — Encounter: Payer: Self-pay | Admitting: Licensed Practical Nurse

## 2023-09-11 ENCOUNTER — Other Ambulatory Visit: Payer: Self-pay | Admitting: Nurse Practitioner

## 2023-09-12 ENCOUNTER — Other Ambulatory Visit: Payer: Self-pay | Admitting: Family

## 2023-09-12 MED ORDER — HYDROXYZINE HCL 25 MG PO TABS: 25.0000 mg | ORAL_TABLET | Freq: Three times a day (TID) | ORAL | 0 refills | Status: DC | PRN

## 2023-09-12 MED ORDER — PROPRANOLOL HCL 10 MG PO TABS: 10.0000 mg | ORAL_TABLET | Freq: Two times a day (BID) | ORAL | 0 refills | Status: DC

## 2023-09-12 NOTE — Telephone Encounter (Signed)
Prescription Request  09/12/2023  LOV: 08/22/2023  What is the name of the medication or equipment? hydrOXYzine and propranolol   Have you contacted your pharmacy to request a refill? Yes   Which pharmacy would you like this sent to?  [facility][id number] [name],  - [address] AT [facility] OF [name] [place] [address] Phone: [phone] Fax: [phone]    Patient notified that their request is being sent to the clinical staff for review and that they should receive a response within 2 business days.   Please advise at Mobile [phone] (mobile)

## 2023-09-17 ENCOUNTER — Encounter: Payer: Self-pay | Admitting: Licensed Practical Nurse

## 2023-09-24 ENCOUNTER — Ambulatory Visit (INDEPENDENT_AMBULATORY_CARE_PROVIDER_SITE_OTHER): Payer: 59

## 2023-09-24 VITALS — BP 105/65 | HR 80 | Ht 64.0 in | Wt 128.0 lb

## 2023-09-24 DIAGNOSIS — M6289 Other specified disorders of muscle: Secondary | ICD-10-CM

## 2023-09-24 DIAGNOSIS — Z3009 Encounter for other general counseling and advice on contraception: Secondary | ICD-10-CM | POA: Diagnosis not present

## 2023-09-24 DIAGNOSIS — R37 Sexual dysfunction, unspecified: Secondary | ICD-10-CM | POA: Diagnosis not present

## 2023-09-24 DIAGNOSIS — Z7689 Persons encountering health services in other specified circumstances: Secondary | ICD-10-CM

## 2023-09-24 MED ORDER — ESTRADIOL 0.1 MG/GM VA CREA
0.2500 | TOPICAL_CREAM | Freq: Every day | VAGINAL | 3 refills | Status: DC
Start: 2023-09-24 — End: 2023-10-28

## 2023-09-24 NOTE — Progress Notes (Signed)
   GYN ENCOUNTER  Encounter for contraception counseling   Subjective  HPI: Donna Wagner is a 17 y.o. G0P0000 who presents today for contraception counseling and sexual dysfunction concerns.   Has been with partner for 1 year but cannot have penetrative sex. States that "it doesn't go in" when they attempt intercourse. Would like recommendations for improving sexual intercourse.   Having regular periods. Started menstruation at age 50. Periods were initially irregular but have gotten more regular with time. She is able to insert tampons without issue during menstruation.  Desires contraception. Interested in [place]. Has tried OCPs several years ago but did not like the way it made her feel. Also has mood disorders for which she is on medications and is concerned about how her mood may be impacted.   History reviewed. No pertinent past medical history. Past Surgical History:  Procedure Laterality Date   APPENDECTOMY     XI ROBOTIC LAPAROSCOPIC ASSISTED APPENDECTOMY N/A 11/19/2022   Procedure: XI ROBOTIC LAPAROSCOPIC ASSISTED APPENDECTOMY;  Surgeon: Carolan Shiver, MD;  Location: [facility] ORS;  Service: General;  Laterality: N/A;   OB History     Gravida  0   Para  0   Term  0   Preterm  0   AB  0   Living  0      SAB  0   IAB  0   Ectopic  0   Multiple  0   Live Births  0          Allergies  Allergen Reactions   Zoloft [Sertraline] Other (See Comments)    Does not work for pt    Review of Systems  12 point ROS negative except for pertinent positives noted in HPO above.   Objective  BP 105/65   Pulse 80   Ht 5\' 4"  (1.626 m)   Wt 128 lb (58.1 kg)   LMP 09/03/2023   BMI 21.97 kg/m   Physical examination GENERAL APPEARANCE: alert, well appearing LUNGS: normal work of breathing HEART: normal heart rate Pelvic exam: VULVA: hypopigmentation, ill-define structure, labia minora absent from end of clitoral hood to vaginal introitus, uretal  meatus not prominent,  VAGINA: hypopigmentation and hypo rugation, CERVIX: present at anterior portion of vaginal vault very close to vaginal introitus. , UTERUS: uterus is normal size, shape, consistency and nontender, ADNEXA: normal adnexa in size, nontender and no masses.     Assessment/Plan - Reviewed anatomy and its possible impact on her ability to have non-painful penetrative intercourse. Discussed how some sexual positions and/or use of stopper rings for penis may decrease amount of penetration and allow for less painful intercourse. Referral to pelvic floor PT provided.  - Given poor definition of vulvar anatomy, will trial a course of topical estrogen cream. RTC in 3-4 months to assess progress. Prescription provided. - Reviewed contraception r/b/a. After thorough discussion, she is leaning towards Nexplanon but would like to schedule insertion at another time.   Donna Wagner, CNM  09/24/23 3:42 PM

## 2023-10-06 ENCOUNTER — Other Ambulatory Visit: Payer: Self-pay | Admitting: Nurse Practitioner

## 2023-10-07 ENCOUNTER — Other Ambulatory Visit: Payer: Self-pay

## 2023-10-07 ENCOUNTER — Telehealth: Payer: Self-pay

## 2023-10-07 MED ORDER — DIVALPROEX SODIUM ER 500 MG PO TB24: 500.0000 mg | ORAL_TABLET | Freq: Every day | ORAL | 0 refills | Status: DC

## 2023-10-07 MED ORDER — ARIPIPRAZOLE 5 MG PO TABS: 5.0000 mg | ORAL_TABLET | Freq: Every day | ORAL | 0 refills | Status: DC

## 2023-10-07 NOTE — Telephone Encounter (Signed)
Prescriptions sent to Pharmacy.

## 2023-10-07 NOTE — Telephone Encounter (Signed)
Should be refilled by Psych. Please call pt and check with her regarding the f/up with Psyc at [facility]  [phone]

## 2023-10-07 NOTE — Telephone Encounter (Signed)
Prescription Request  10/07/2023  LOV: Visit date not found  What is the name of the medication or equipment? ARIPiprazole (ABILIFY) 5 MG tablet and divalproex (DEPAKOTE ER) 500 MG 24 hr tablet  Have you contacted your pharmacy to request a refill? No   Which pharmacy would you like this sent to?  [facility] #[id number] [name], [place] - [address] AT [facility] OF [name] [place] [address] Phone: [phone] Fax: [phone]    Patient notified that their request is being sent to the clinical staff for review and that they should receive a response within 2 business days.   Please advise at Mobile [phone] (mobile)  Patient states she about to be out of this medication.  Patient states she has five pills left of each.  Patient states she needs enough to get her through to her appointment with her psychiatrist on 10/27/2023.

## 2023-10-10 NOTE — Telephone Encounter (Signed)
Further refill through psychiatry.

## 2023-10-28 ENCOUNTER — Ambulatory Visit (INDEPENDENT_AMBULATORY_CARE_PROVIDER_SITE_OTHER): Payer: 59 | Admitting: Psychiatry

## 2023-10-28 ENCOUNTER — Encounter: Payer: Self-pay | Admitting: Psychiatry

## 2023-10-28 VITALS — BP 112/78 | HR 77 | Temp 98.7°F | Ht 64.0 in | Wt 123.0 lb

## 2023-10-28 DIAGNOSIS — F4310 Post-traumatic stress disorder, unspecified: Secondary | ICD-10-CM

## 2023-10-28 DIAGNOSIS — F3341 Major depressive disorder, recurrent, in partial remission: Secondary | ICD-10-CM | POA: Diagnosis not present

## 2023-10-28 DIAGNOSIS — Z8659 Personal history of other mental and behavioral disorders: Secondary | ICD-10-CM

## 2023-10-28 DIAGNOSIS — Z79899 Other long term (current) drug therapy: Secondary | ICD-10-CM

## 2023-10-28 DIAGNOSIS — F4010 Social phobia, unspecified: Secondary | ICD-10-CM | POA: Diagnosis not present

## 2023-10-28 DIAGNOSIS — F1290 Cannabis use, unspecified, uncomplicated: Secondary | ICD-10-CM | POA: Insufficient documentation

## 2023-10-28 MED ORDER — ARIPIPRAZOLE 5 MG PO TABS
5.0000 mg | ORAL_TABLET | Freq: Every day | ORAL | 0 refills | Status: DC
Start: 2023-10-28 — End: 2024-01-27

## 2023-10-28 MED ORDER — DIVALPROEX SODIUM ER 500 MG PO TB24
500.0000 mg | ORAL_TABLET | Freq: Every day | ORAL | 0 refills | Status: DC
Start: 2023-10-28 — End: 2023-12-08
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
## 2023-10-28 NOTE — Patient Instructions (Signed)
Tardive Dyskinesia Tardive dyskinesia is a disorder that causes uncontrollable body movements. It occurs in some people who are taking certain medicines to treat a mental illness (neuroleptic medicine) or have taken this type of medicine in the past. These medicines block the effects of a specific brain chemical called dopamine.  Sometimes, tardive dyskinesia starts months or years after someone took the medicine. Not everyone who takes a neuroleptic medicine will get tardive dyskinesia. What are the causes? This condition is caused by changes in your brain that are associated with taking a neuroleptic medicine. What increases the risk? If you are taking a neuroleptic medicine, your risk for tardive dyskinesia may be higher if: You are taking an older type of neuroleptic medicine. You have been taking the medicine for a long time at a high dose. You are a woman past the age of menopause. You are older than 60 years. You have a history of alcohol or drug abuse. What are the signs or symptoms? Abnormal, uncontrollable movements are the main symptom of tardive dyskinesia. These types of movements may include: Grimacing. Sticking out or twisting your tongue. Making chewing or sucking sounds. Blinking your eyes. Twisting, swaying, or thrusting your body. Foot tapping or finger waving. Rapid movements of your arms or legs. How is this diagnosed? Your health care provider may suspect that you have tardive dyskinesia if: You have been taking neuroleptic medicines. You have abnormal movements that you cannot control. If you are taking a medicine that can cause tardive dyskinesia, your health care provider may screen you for early signs of the condition. This may include: Observing your body movements. Using a specific rating scale called the Abnormal Involuntary Movement Scale (AIMS). You may also have tests to rule out other conditions that cause abnormal body movements, including: Parkinson's  disease. Huntington's disease. Stroke. How is this treated? The best treatment for tardive dyskinesia is to lower the dose of your medicine or to switch to a different medicine at the first sign of abnormal and uncontrolled movements. There is no cure for long-term (chronic) tardive dyskinesia. Some medicines may help control the movements. These include: Clozapine, a medicine used to treat mental illness (antipsychotic). Some muscle relaxants. Some anti-seizure medicines. Some medicines used to treat high blood pressure. Some tranquilizers (sedatives). Follow these instructions at home:     Take over-the-counter and prescription medicines only as told by your health care provider. Do not stop or start taking any medicines without talking to your health care provider first. Do not abuse drugs or alcohol. Keep all follow-up visits. This is important. Contact a health care provider if: You are unable to eat or drink. You have had a fall. Your symptoms get worse. Summary Tardive dyskinesia is a disorder that causes uncontrollable body movements. These may include grimacing, sticking out or twisting your tongue, blinking your eyes, or rapid movements of your arms or legs. The condition occurs in some people who are taking certain medicines to treat a mental illness or have taken this type of medicine in the past. The best treatment for tardive dyskinesia is to lower the dose of your medicine or to switch to a different medicine at the first sign of abnormal and uncontrolled movements. There is no cure for long-term (chronic) tardive dyskinesia, but some medicines may help control the movements. This information is not intended to replace advice given to you by your health care provider. Make sure you discuss any questions you have with your health care provider. Document Revised:  09/02/2021 Document Reviewed: 09/02/2021 Elsevier Patient Education  2024 [employer].

## 2023-10-30 ENCOUNTER — Ambulatory Visit (INDEPENDENT_AMBULATORY_CARE_PROVIDER_SITE_OTHER): Payer: 59 | Admitting: Licensed Clinical Social Worker

## 2023-10-30 DIAGNOSIS — F4010 Social phobia, unspecified: Secondary | ICD-10-CM

## 2023-10-30 DIAGNOSIS — F4310 Post-traumatic stress disorder, unspecified: Secondary | ICD-10-CM

## 2023-10-30 DIAGNOSIS — F3341 Major depressive disorder, recurrent, in partial remission: Secondary | ICD-10-CM | POA: Diagnosis not present

## 2023-10-30 DIAGNOSIS — Z8659 Personal history of other mental and behavioral disorders: Secondary | ICD-10-CM | POA: Diagnosis not present
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
## 2023-11-03 ENCOUNTER — Ambulatory Visit (INDEPENDENT_AMBULATORY_CARE_PROVIDER_SITE_OTHER): Payer: 59 | Admitting: Licensed Clinical Social Worker

## 2023-11-03 DIAGNOSIS — F3341 Major depressive disorder, recurrent, in partial remission: Secondary | ICD-10-CM | POA: Diagnosis not present

## 2023-11-03 DIAGNOSIS — Z8659 Personal history of other mental and behavioral disorders: Secondary | ICD-10-CM | POA: Diagnosis not present

## 2023-11-03 DIAGNOSIS — F4310 Post-traumatic stress disorder, unspecified: Secondary | ICD-10-CM

## 2023-11-03 DIAGNOSIS — F4010 Social phobia, unspecified: Secondary | ICD-10-CM
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
## 2023-11-11 ENCOUNTER — Ambulatory Visit (INDEPENDENT_AMBULATORY_CARE_PROVIDER_SITE_OTHER): Payer: 59 | Admitting: Licensed Clinical Social Worker

## 2023-11-11 DIAGNOSIS — F3341 Major depressive disorder, recurrent, in partial remission: Secondary | ICD-10-CM | POA: Diagnosis not present

## 2023-11-11 DIAGNOSIS — F4010 Social phobia, unspecified: Secondary | ICD-10-CM

## 2023-11-11 DIAGNOSIS — F4310 Post-traumatic stress disorder, unspecified: Secondary | ICD-10-CM | POA: Diagnosis not present

## 2023-11-11 NOTE — Progress Notes (Signed)
THERAPIST PROGRESS NOTE  Virtual Visit via Video Note  I connected with Donna Wagner on 11/11/23 at  1:00 PM EST by a video enabled telemedicine application and verified that I am speaking with the correct person using two identifiers.  Location: Patient: Address on File  Provider: Providers home    I discussed the limitations of evaluation and management by telemedicine and the availability of in person appointments. The patient expressed understanding and agreed to proceed.   I discussed the assessment and treatment plan with the patient. The patient was provided an opportunity to ask questions and all were answered. The patient agreed with the plan and demonstrated an understanding of the instructions.   The patient was advised to call back or seek an in-person evaluation if the symptoms worsen or if the condition fails to improve as anticipated.  I provided 42 minutes of non-face-to-face time during this encounter.   Dereck Leep, LCSW   Session Time: 1-1:42pm  Participation Level: Active  Behavioral Response: CasualAlertEuthymic  Type of Therapy: Individual Therapy  Treatment Goals addressed: LTG: Allecia will stabilize mood and increase goal-directed behavior as measured by self report           Goal: LTG: Elimination of maladaptive behaviors and thinking patterns which interfere with resolution of trauma as evidenced by self report            Goal: STG: Amel will verbalize an increased sense of mastery over PTSD symptoms by using several techniques to cope with flashbacks, decrease the power of triggers, and decrease negative thinking     ProgressTowards Goals: Progressing  Interventions: CBT, Assertiveness Training, and Reframing  Summary:  Donna Wagner is a 18 y.o. female who presents with symptoms of trauma, anxiety, depression.  Patient identifies symptoms to include reexperiencing (nightmares), irritability, intrusive thoughts, uncontrollable  worry, negative self affect, low mood. Pt was oriented times 5. Pt was cooperative and engaged. Pt reports SI but denies intent or plan at this time. Denies HI/AVH.   Discussed improving communication with her boyfriend. Reviewed communication styles. 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 Role played ways she can utilize assertive communication in addressing improving communication with her boyfriend.   Reflected on starting her new job this week.She identified she was nervous about meeting new people at her new job due to fears they are judging her and shares she finds herself comparing her psychical appearance. Worked with patient to challenge unhelpful thoughts around incident at work.   Reports she has not experienced feelings of anger or aggressive communication this week. Pt identifies she has been sleeping more. Shares she has missed her medications a few days this week, which she acknowledges makes her sleepy. Pt reports she has started going to the gym every day. Reports feeling accomplished in working towards a goal. Identifies barrier of social anxiety due to the large crowds and expressed fearful thoughts of running into her ex-Paramore. Reports hypervigilance due to past encounters. Discussed plan should patient run into this ex-paramour at the gym. Worked with patient on addressing concerns people are judging her.   Pt reports hope after session acknowledging efforts to reframe unhelpful thoughts, challenge mind reading tenancies, and improve communication within relationships.   Suicidal/Homicidal: Yeswithout intent/plan  Therapist Response: Clinician utilized active and supportive reflection to create a safe environment for patient to process recent life stressors. Clinician assessed for current symptoms, safety, stressors since last session. Educated pt on communication  styles and roleplayed use of assertive communication. Educated pt  on CBT techniques to challenge unhelpful thoughts.   Plan: Return again in 1 week.  Diagnosis: Recurrent major depressive disorder, in partial remission (HCC)  PTSD (post-traumatic stress disorder)  Social anxiety disorder   Collaboration of Care:  AEB psychiatrist can access notes and cln. Will review psychiatrists' notes. Check in with the patient and will see LCSW per availability. Patient agreed with treatment recommendations.    Patient/Guardian was advised Release of Information must be obtained prior to any record release in order to collaborate their care with an outside provider. Patient/Guardian was advised if they have not already done so to contact the registration department to sign all necessary forms in order for [place] to release information regarding their care.   Consent: Patient/Guardian gives verbal consent for treatment and assignment of benefits for services provided during this visit. Patient/Guardian expressed understanding and agreed to proceed.   Dereck Leep, LCSW 11/11/2023

## 2023-11-20 ENCOUNTER — Ambulatory Visit (INDEPENDENT_AMBULATORY_CARE_PROVIDER_SITE_OTHER): Payer: 59 | Admitting: Licensed Clinical Social Worker

## 2023-11-20 DIAGNOSIS — Z91199 Patient's noncompliance with other medical treatment and regimen due to unspecified reason: Secondary | ICD-10-CM

## 2023-11-20 NOTE — Progress Notes (Signed)
Clinician attempted session via face-to-face, but [name] did not appear for her session. [name]. called pt. LVM about rescheduling.

## 2023-11-25 ENCOUNTER — Ambulatory Visit: Payer: No Typology Code available for payment source | Admitting: Licensed Clinical Social Worker

## 2023-11-25 DIAGNOSIS — F4010 Social phobia, unspecified: Secondary | ICD-10-CM

## 2023-11-25 DIAGNOSIS — F4310 Post-traumatic stress disorder, unspecified: Secondary | ICD-10-CM

## 2023-11-25 DIAGNOSIS — F3341 Major depressive disorder, recurrent, in partial remission: Secondary | ICD-10-CM
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
## 2023-12-02 ENCOUNTER — Ambulatory Visit (INDEPENDENT_AMBULATORY_CARE_PROVIDER_SITE_OTHER): Payer: Self-pay | Admitting: Licensed Clinical Social Worker

## 2023-12-02 DIAGNOSIS — Z91199 Patient's noncompliance with other medical treatment and regimen due to unspecified reason: Secondary | ICD-10-CM

## 2023-12-02 NOTE — Progress Notes (Signed)
Clinician attempted session via face-to-face, but Donna Wagner did not appear for her session. [name]. called pt. LVM about rescheduling.

## 2023-12-03 ENCOUNTER — Telehealth: Payer: Self-pay | Admitting: Psychiatry

## 2023-12-06 ENCOUNTER — Other Ambulatory Visit: Payer: Self-pay | Admitting: Psychiatry

## 2023-12-06 DIAGNOSIS — Z8659 Personal history of other mental and behavioral disorders: Secondary | ICD-10-CM

## 2023-12-06 DIAGNOSIS — F4310 Post-traumatic stress disorder, unspecified: Secondary | ICD-10-CM

## 2023-12-06 DIAGNOSIS — F3341 Major depressive disorder, recurrent, in partial remission: Secondary | ICD-10-CM

## 2023-12-09 ENCOUNTER — Other Ambulatory Visit (HOSPITAL_COMMUNITY): Payer: Self-pay

## 2023-12-10 ENCOUNTER — Encounter: Payer: Self-pay | Admitting: Licensed Clinical Social Worker

## 2023-12-10 ENCOUNTER — Telehealth (HOSPITAL_COMMUNITY): Payer: Self-pay | Admitting: Professional

## 2023-12-10 ENCOUNTER — Ambulatory Visit (INDEPENDENT_AMBULATORY_CARE_PROVIDER_SITE_OTHER): Payer: No Typology Code available for payment source | Admitting: Licensed Clinical Social Worker

## 2023-12-10 DIAGNOSIS — F4310 Post-traumatic stress disorder, unspecified: Secondary | ICD-10-CM

## 2023-12-10 DIAGNOSIS — F4010 Social phobia, unspecified: Secondary | ICD-10-CM

## 2023-12-10 DIAGNOSIS — F3341 Major depressive disorder, recurrent, in partial remission: Secondary | ICD-10-CM
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
## 2023-12-11 ENCOUNTER — Other Ambulatory Visit (HOSPITAL_COMMUNITY): Payer: Self-pay

## 2023-12-12 ENCOUNTER — Other Ambulatory Visit (HOSPITAL_COMMUNITY): Payer: Self-pay

## 2023-12-12 ENCOUNTER — Telehealth (HOSPITAL_COMMUNITY): Payer: Self-pay | Admitting: Professional

## 2023-12-12 MED ORDER — HYDROXYZINE HCL 50 MG PO TABS: 50.0000 mg | ORAL_TABLET | Freq: Two times a day (BID) | ORAL | 1 refills | Status: AC | PRN

## 2023-12-15 ENCOUNTER — Other Ambulatory Visit (HOSPITAL_COMMUNITY): Payer: Self-pay | Attending: Psychiatry | Admitting: Professional

## 2023-12-15 DIAGNOSIS — F411 Generalized anxiety disorder: Secondary | ICD-10-CM | POA: Diagnosis not present

## 2023-12-15 DIAGNOSIS — F4310 Post-traumatic stress disorder, unspecified: Secondary | ICD-10-CM | POA: Insufficient documentation

## 2023-12-15 DIAGNOSIS — F332 Major depressive disorder, recurrent severe without psychotic features: Secondary | ICD-10-CM

## 2023-12-15 DIAGNOSIS — F329 Major depressive disorder, single episode, unspecified: Secondary | ICD-10-CM | POA: Diagnosis present
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
## 2023-12-16 ENCOUNTER — Other Ambulatory Visit (HOSPITAL_COMMUNITY): Payer: Self-pay

## 2023-12-16 DIAGNOSIS — Z9152 Personal history of nonsuicidal self-harm: Secondary | ICD-10-CM | POA: Insufficient documentation

## 2023-12-16 DIAGNOSIS — R4589 Other symptoms and signs involving emotional state: Secondary | ICD-10-CM | POA: Insufficient documentation

## 2023-12-16 DIAGNOSIS — F4310 Post-traumatic stress disorder, unspecified: Secondary | ICD-10-CM | POA: Insufficient documentation

## 2023-12-16 DIAGNOSIS — R45851 Suicidal ideations: Secondary | ICD-10-CM | POA: Insufficient documentation

## 2023-12-16 DIAGNOSIS — F3341 Major depressive disorder, recurrent, in partial remission: Secondary | ICD-10-CM | POA: Insufficient documentation

## 2023-12-16 DIAGNOSIS — F1290 Cannabis use, unspecified, uncomplicated: Secondary | ICD-10-CM | POA: Insufficient documentation

## 2023-12-16 DIAGNOSIS — Z7389 Other problems related to life management difficulty: Secondary | ICD-10-CM | POA: Insufficient documentation

## 2023-12-16 DIAGNOSIS — Z8659 Personal history of other mental and behavioral disorders: Secondary | ICD-10-CM | POA: Insufficient documentation

## 2023-12-16 DIAGNOSIS — F411 Generalized anxiety disorder: Secondary | ICD-10-CM | POA: Insufficient documentation

## 2023-12-16 DIAGNOSIS — Z79899 Other long term (current) drug therapy: Secondary | ICD-10-CM | POA: Insufficient documentation

## 2023-12-17 ENCOUNTER — Other Ambulatory Visit (HOSPITAL_COMMUNITY): Payer: Self-pay | Attending: Psychiatry | Admitting: Licensed Clinical Social Worker

## 2023-12-17 ENCOUNTER — Telehealth (HOSPITAL_COMMUNITY): Payer: Self-pay | Admitting: Professional

## 2023-12-17 ENCOUNTER — Other Ambulatory Visit (HOSPITAL_COMMUNITY): Payer: Self-pay | Attending: Psychiatry

## 2023-12-17 DIAGNOSIS — F3341 Major depressive disorder, recurrent, in partial remission: Secondary | ICD-10-CM | POA: Diagnosis present

## 2023-12-17 DIAGNOSIS — Z8659 Personal history of other mental and behavioral disorders: Secondary | ICD-10-CM

## 2023-12-17 DIAGNOSIS — Z79899 Other long term (current) drug therapy: Secondary | ICD-10-CM | POA: Diagnosis not present

## 2023-12-17 DIAGNOSIS — F603 Borderline personality disorder: Secondary | ICD-10-CM

## 2023-12-17 DIAGNOSIS — Z9152 Personal history of nonsuicidal self-harm: Secondary | ICD-10-CM | POA: Diagnosis not present

## 2023-12-17 DIAGNOSIS — F4310 Post-traumatic stress disorder, unspecified: Secondary | ICD-10-CM | POA: Diagnosis not present

## 2023-12-17 DIAGNOSIS — R45851 Suicidal ideations: Secondary | ICD-10-CM | POA: Diagnosis not present

## 2023-12-17 DIAGNOSIS — R4589 Other symptoms and signs involving emotional state: Secondary | ICD-10-CM | POA: Diagnosis not present

## 2023-12-17 DIAGNOSIS — F1290 Cannabis use, unspecified, uncomplicated: Secondary | ICD-10-CM | POA: Diagnosis not present

## 2023-12-17 DIAGNOSIS — Z7389 Other problems related to life management difficulty: Secondary | ICD-10-CM | POA: Diagnosis not present

## 2023-12-17 DIAGNOSIS — F332 Major depressive disorder, recurrent severe without psychotic features: Secondary | ICD-10-CM

## 2023-12-17 MED ORDER — TRAZODONE HCL 50 MG PO TABS
25.0000 mg | ORAL_TABLET | Freq: Every evening | ORAL | 0 refills | Status: AC | PRN
Start: 2023-12-17 — End: ?

## 2023-12-17 MED ORDER — DIVALPROEX SODIUM ER 250 MG PO TB24: 250.0000 mg | ORAL_TABLET | Freq: Every day | ORAL | 0 refills | Status: DC

## 2023-12-17 MED ORDER — LAMOTRIGINE 25 MG PO TABS: ORAL_TABLET | ORAL | 0 refills | Status: DC

## 2023-12-17 NOTE — Psych (Signed)
 Active     OP Depression     LTG: Reduce frequency, intensity, and duration of depression symptoms so that daily functioning is improved (Initial)     Start:  12/17/23    Expected End:  03/15/24         LTG: Increase coping skills to manage depression and improve ability to perform daily activities (Initial)     Start:  12/17/23    Expected End:  03/15/24         STG: Donna Wagner will attend at least 80% of scheduled PHP sessions    (Initial)     Start:  12/17/23    Expected End:  03/15/24         STG: Donna Wagner will complete at least 80% of assigned homework  (Initial)     Start:  12/17/23    Expected End:  03/15/24         STG: Donna Wagner will identify cognitive patterns and beliefs that support depression (Initial)     Start:  12/17/23    Expected End:  03/15/24         Encourage Donna Wagner to participate in recovery peer support activities weekly      Start:  12/17/23         Work with Donna Wagner to identify the major components of a recent episode of depression: physical symptoms, major thoughts and images, and major behaviors they experienced     Start:  12/17/23         Therapist will educate patient on cognitive distortions and the rationale for treatment of depression     Start:  12/17/23         Donna Wagner will identify AT LEAST 3 cognitive distortions they are currently using and write reframing statements to replace them     Start:  12/17/23         Therapist will review PLEASE Skills (Treat Physical Illness, Balance Eating, Avoid Mood-Altering Substances, Balance Sleep and Get Exercise) with patient     Start:  12/17/23           Donna Wagner verbally agrees to treatment plan.
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
## 2023-12-18 ENCOUNTER — Other Ambulatory Visit (HOSPITAL_COMMUNITY): Payer: Self-pay | Admitting: Licensed Clinical Social Worker

## 2023-12-18 ENCOUNTER — Other Ambulatory Visit (HOSPITAL_COMMUNITY): Payer: Self-pay

## 2023-12-18 DIAGNOSIS — F4310 Post-traumatic stress disorder, unspecified: Secondary | ICD-10-CM

## 2023-12-18 DIAGNOSIS — F332 Major depressive disorder, recurrent severe without psychotic features: Secondary | ICD-10-CM

## 2023-12-18 DIAGNOSIS — F603 Borderline personality disorder: Secondary | ICD-10-CM

## 2023-12-18 DIAGNOSIS — R4589 Other symptoms and signs involving emotional state: Secondary | ICD-10-CM

## 2023-12-18 DIAGNOSIS — F3341 Major depressive disorder, recurrent, in partial remission: Secondary | ICD-10-CM | POA: Diagnosis not present

## 2023-12-19 ENCOUNTER — Other Ambulatory Visit (HOSPITAL_COMMUNITY): Payer: Self-pay | Admitting: Licensed Clinical Social Worker

## 2023-12-19 ENCOUNTER — Encounter (HOSPITAL_COMMUNITY): Payer: Self-pay

## 2023-12-19 ENCOUNTER — Other Ambulatory Visit (HOSPITAL_COMMUNITY): Payer: Self-pay

## 2023-12-19 DIAGNOSIS — F1290 Cannabis use, unspecified, uncomplicated: Secondary | ICD-10-CM | POA: Insufficient documentation

## 2023-12-19 DIAGNOSIS — Z8659 Personal history of other mental and behavioral disorders: Secondary | ICD-10-CM | POA: Insufficient documentation

## 2023-12-19 DIAGNOSIS — F3341 Major depressive disorder, recurrent, in partial remission: Secondary | ICD-10-CM | POA: Insufficient documentation

## 2023-12-19 DIAGNOSIS — Z79899 Other long term (current) drug therapy: Secondary | ICD-10-CM | POA: Insufficient documentation

## 2023-12-19 DIAGNOSIS — R45851 Suicidal ideations: Secondary | ICD-10-CM | POA: Insufficient documentation

## 2023-12-19 DIAGNOSIS — R4589 Other symptoms and signs involving emotional state: Secondary | ICD-10-CM

## 2023-12-19 DIAGNOSIS — Z9152 Personal history of nonsuicidal self-harm: Secondary | ICD-10-CM | POA: Insufficient documentation

## 2023-12-19 DIAGNOSIS — F4310 Post-traumatic stress disorder, unspecified: Secondary | ICD-10-CM

## 2023-12-19 DIAGNOSIS — F332 Major depressive disorder, recurrent severe without psychotic features: Secondary | ICD-10-CM

## 2023-12-19 NOTE — Therapy (Signed)
 [facility] PARTIAL HOSPITALIZATION PROGRAM [address] [place], [place], [address] Phone: [phone]   Fax:  [phone]  Occupational Therapy Treatment Virtual Visit via Video Note  I connected with Donna Wagner on 12/19/23 at  8:00 AM EST by a video enabled telemedicine application and verified that I am speaking with the correct person using two identifiers.  Location: Patient: home Provider: office   I discussed the limitations of evaluation and management by telemedicine and the availability of in person appointments. The patient expressed understanding and agreed to proceed.    The patient was advised to call back or seek an in-person evaluation if the symptoms worsen or if the condition fails to improve as anticipated.  I provided 55 minutes of non-face-to-face time during this encounter.   Patient Details  Name: Donna Wagner MRN: [id number] Date of Birth: Jul 08, 2006 No data recorded  Encounter Date: 12/18/2023   OT End of Session - 12/19/23 1151     Visit Number 2    Number of Visits 20    Date for OT Re-Evaluation 01/18/24    OT Start Time 1200    OT Stop Time 1255    OT Time Calculation (min) 55 min    Activity Tolerance Patient tolerated treatment well             Past Medical History:  Diagnosis Date   Anxiety    Bipolar disorder (HCC)    Depression     Past Surgical History:  Procedure Laterality Date   APPENDECTOMY     XI ROBOTIC LAPAROSCOPIC ASSISTED APPENDECTOMY N/A 11/19/2022   Procedure: XI ROBOTIC LAPAROSCOPIC ASSISTED APPENDECTOMY;  Surgeon: Carolan Shiver, MD;  Location: [facility] ORS;  Service: General;  Laterality: N/A;    There were no vitals filed for this visit.   Subjective Assessment - 12/19/23 1151     Currently in Pain? No/denies    Pain Score 0-No pain               Group Session:  S: Doing a bit better today.   O: In this communication group therapy session,  facilitated by an occupational therapist, participants explored several key subtopics aimed at enhancing their interpersonal skills. The session began with a discussion on the use of "I" and "AND" statements, emphasizing personal responsibility and constructive language in expressing feelings and needs. Participants then practiced active listening techniques to improve their ability to fully understand and respond to others. The group also delved into assertive communication, learning how to express themselves confidently and respectfully. Emotional regulation skills were addressed, providing strategies for managing emotions during interactions. Social skills training included role-playing scenarios to build confidence in various social contexts. Feedback and reflection were integral parts of the session, with participants offering and receiving constructive feedback to foster personal growth. The group identified common barriers to effective communication, such as anxiety, fear of judgment, and past negative experiences, and discussed strategies to overcome these challenges, including mindfulness practices and building a supportive network.   A: The patient actively participated in the group therapy session, demonstrating a high level of engagement and enthusiasm. They contributed to discussions on "I" and "AND" statements by sharing personal examples and insights. During the active listening exercises, the patient was attentive and provided thoughtful feedback to peers. They effectively practiced assertive communication techniques and showed a clear understanding of emotional regulation strategies. In social skills role-playing, the patient was confident and responsive, indicating a good grasp of the concepts.  The patient also engaged in the feedback and reflection segment, offering constructive comments and showing receptivity to feedback from others. Their proactive approach and willingness to explore  personal barriers to communication suggest significant progress and motivation to improve interpersonal skills.    P: Continue to attend PHP OT group sessions 5x week for 4 weeks to promote daily structure, social engagement, and opportunities to develop and utilize adaptive strategies to maximize functional performance in preparation for safe transition and integration back into school, work, and the community. Plan to address topic of pt 2 in next OT group session.                    OT Education - 12/19/23 1151     Education Details Communication 1    Person(s) Educated Patient    Methods Explanation;Handout    Comprehension Verbalized understanding              OT Short Term Goals - 12/19/23 0955       OT SHORT TERM GOAL #1   Title Patient will be educated on strategies to improve psychosocial skills needed to participate fully in all daily, work, and leisure activities.    Time 4    Period Weeks    Status On-going    Target Date 01/18/24      OT SHORT TERM GOAL #2   Title Pt will apply psychosocial skills and coping mechanisms to daily activities in order to function independently and reintegrate into community dwelling    Status On-going      OT SHORT TERM GOAL #3   Title Pt will choose and/or engage in 1-3 socially engaging leisure activities to improve social participation skills upon reintegrating into community    Status On-going      OT SHORT TERM GOAL #4   Title Patient will be educated on strategies to improve psychosocial skills needed to participate fully in all daily, work, and leisure activities.    Status On-going                      Plan - 12/19/23 1151     Occupational performance deficits (Please refer to evaluation for details): IADL's;Rest and Sleep;Education;Work;Leisure;Social Participation    Psychosocial Skills Coping Strategies;Habits             Patient will benefit from skilled therapeutic intervention  in order to improve the following deficits and impairments:       Psychosocial Skills: Coping Strategies, Habits   Visit Diagnosis: Difficulty coping    Problem List Patient Active Problem List   Diagnosis Date Noted   Generalized anxiety disorder 12/15/2023   Social anxiety disorder 10/28/2023   Recurrent major depressive disorder, in partial remission (HCC) 10/28/2023   Hx of bipolar disorder 10/28/2023   Long term current use of cannabis 10/28/2023   High risk medication use 10/28/2023   Vitamin D deficiency 08/22/2023   MDD (major depressive disorder), recurrent severe, without psychosis (HCC) 08/10/2023   Major depressive disorder, recurrent episode with mixed features (HCC) 08/03/2023   Anxiety and depression 05/29/2023   Sexual dysfunction 05/29/2023   PTSD (post-traumatic stress disorder) 05/15/2023   Nightmares 05/15/2023   Reactive depression 05/15/2023   Acute appendicitis with localized peritonitis 11/19/2022   MDD (major depressive disorder), single episode, severe , no psychosis (HCC) 12/19/2020   Overdose 12/19/2020   Suicide ideation 12/19/2020   Bipolar II disorder (HCC) 12/19/2020    Ted Mcalpine, OT 12/19/2023,  11:52 AM  Kerrin Champagne, OT   [facility] PROGRAM [address] [place], [place], [address] Phone: [phone]   Fax:  [phone]  Name: Donna Wagner MRN: [id number] Date of Birth: Jun 06, 2006

## 2023-12-19 NOTE — Therapy (Signed)
 [facility] PARTIAL HOSPITALIZATION PROGRAM [address] [place], [place], [address] Phone: [phone]   Fax:  [phone]  Occupational Therapy Evaluation Virtual Visit via Video Note  I connected with Donna Wagner on 12/19/23 at  8:00 AM EST by a video enabled telemedicine application and verified that I am speaking with the correct person using two identifiers.  Location: Patient: home Provider: office   I discussed the limitations of evaluation and management by telemedicine and the availability of in person appointments. The patient expressed understanding and agreed to proceed.    The patient was advised to call back or seek an in-person evaluation if the symptoms worsen or if the condition fails to improve as anticipated.  I provided 85 minutes of non-face-to-face time during this encounter.   Patient Details  Name: Donna Wagner MRN: [id number] Date of Birth: 28-Jan-2006 No data recorded  Encounter Date: 12/17/2023   OT End of Session - 12/19/23 0953     Visit Number 1    Number of Visits 20    Date for OT Re-Evaluation 01/18/24    OT Start Time 0930    OT Stop Time 1255   Eval: 30; Tx: 55   OT Time Calculation (min) 205 min    Activity Tolerance Patient tolerated treatment well    Behavior During Therapy WFL for tasks assessed/performed             Past Medical History:  Diagnosis Date   Anxiety    Bipolar disorder (HCC)    Depression     Past Surgical History:  Procedure Laterality Date   APPENDECTOMY     XI ROBOTIC LAPAROSCOPIC ASSISTED APPENDECTOMY N/A 11/19/2022   Procedure: XI ROBOTIC LAPAROSCOPIC ASSISTED APPENDECTOMY;  Surgeon: Carolan Shiver, MD;  Location: [facility] ORS;  Service: General;  Laterality: N/A;    There were no vitals filed for this visit.   Subjective Assessment - 12/19/23 0951     Subjective  I am hoping ot learn new coping skills, communication skills, and how to decrease self harm  thinking.    Pertinent History PTSD, MDD, BPDII, GAD    Currently in Pain? No/denies    Pain Score 0-No pain    Multiple Pain Sites No               OT Assessment  OCAIRS Mental Health Interview Summary of Client Scores:  Facilitates participation in occupation Allows participation in occupation Inhibits participation in occupation Restricts participation in occupation Comments:  Roles   X    Habits   X    Personal Causation  X     Values   X    Interests  X     Skills  X     Short-Term Goals  X     Long-term Goals   X    Interpretation of Past Experiences   X    Physical Environment   X    Social Environment   X    Readiness for Change  X       Need for Occupational Therapy:  4 Shows positive occupational participation, no need for OT.   3 Need for minimal intervention/consultative participation   2 Need for OT intervention indicated to restore/improve participation   1 Need for extensive OT intervention indicated to improve participation.  Referral for follow up services also recommended.   Assessment:  Patient demonstrates behavior that INHIBITS participation in occupation.  Patient will benefit from occupational therapy  intervention in order to improve time management, financial management, stress management, job readiness skills, social skills, and health management skills in preparation to return to full time community living and to be a productive community member.    Plan:  Patient will participate in skilled occupational therapy sessions individually or in a group setting to improve coping skills, psychosocial skills, and emotional skills required to return to prior level of function. Treatment will be 4-5 times per week for 4 weeks.    Group Session:  O:  During today's OT group session, the patient participated in an educational segment about the importance of goal-setting and the application of the SMART framework to enhance daily life, particularly focusing on  ADLs and iADLs. The session began with five open-ended pre-session questions that facilitated group discussion and introspection about their current relationship with goals. Following the introduction and educational segment, participants engaged in brainstorming and group discussions to devise hypothetical SMART goals. The session concluded with five post-session questions to reinforce understanding and facilitate reflection. Throughout the session, there was a range of engagement levels noted among the participants.   A:  Patient demonstrated a high level of engagement throughout the session. They actively participated in discussions, sharing personal experiences related to goal setting and challenges faced. Patient was able to clearly articulate an understanding of the SMART framework and proposed personal SMART goals related to their own ADLs with minimal assistance. They expressed enthusiasm about applying what they learned to their daily routine and appeared motivated to make changes.                     OT Education - 12/19/23 [phone]     Education Details OCAIRS / Tx: SMART [place] 3    Person(s) Educated Patient    Methods Explanation;Handout    Comprehension Verbalized understanding              OT Short Term Goals - 12/19/23 0955       OT SHORT TERM GOAL #1   Title Patient will be educated on strategies to improve psychosocial skills needed to participate fully in all daily, work, and leisure activities.    Time 4    Period Weeks    Status On-going    Target Date 01/18/24      OT SHORT TERM GOAL #2   Title Pt will apply psychosocial skills and coping mechanisms to daily activities in order to function independently and reintegrate into community dwelling    Status On-going      OT SHORT TERM GOAL #3   Title Pt will choose and/or engage in 1-3 socially engaging leisure activities to improve social participation skills upon reintegrating into community     Status On-going      OT SHORT TERM GOAL #4   Title Patient will be educated on strategies to improve psychosocial skills needed to participate fully in all daily, work, and leisure activities.    Status On-going                      Plan - 12/19/23 0954     Clinical Impression Statement Pt presents w/ deficis across multiple psychosocial doamins that inhibit daily activities, communication and occupational performance.    OT Occupational Profile and History Problem Focused Assessment - Including review of records relating to presenting problem    Occupational performance deficits (Please refer to evaluation for details): IADL's;Rest and Sleep;Education;Work;Leisure;Social Participation    Rehab Potential Good  Clinical Decision Making Limited treatment options, no task modification necessary    Comorbidities Affecting Occupational Performance: None    Modification or Assistance to Complete Evaluation  No modification of tasks or assist necessary to complete eval    OT Frequency 5x / week    OT Duration 4 weeks    OT Treatment/Interventions Psychosocial skills training;Coping strategies training    Consulted and Agree with Plan of Care Patient             Patient will benefit from skilled therapeutic intervention in order to improve the following deficits and impairments:           Visit Diagnosis: Difficulty coping    Problem List Patient Active Problem List   Diagnosis Date Noted   Generalized anxiety disorder 12/15/2023   Social anxiety disorder 10/28/2023   Recurrent major depressive disorder, in partial remission (HCC) 10/28/2023   Hx of bipolar disorder 10/28/2023   Long term current use of cannabis 10/28/2023   High risk medication use 10/28/2023   Vitamin D deficiency 08/22/2023   MDD (major depressive disorder), recurrent severe, without psychosis (HCC) 08/10/2023   Major depressive disorder, recurrent episode with mixed features (HCC)  08/03/2023   Anxiety and depression 05/29/2023   Sexual dysfunction 05/29/2023   PTSD (post-traumatic stress disorder) 05/15/2023   Nightmares 05/15/2023   Reactive depression 05/15/2023   Acute appendicitis with localized peritonitis 11/19/2022   MDD (major depressive disorder), single episode, severe , no psychosis (HCC) 12/19/2020   Overdose 12/19/2020   Suicide ideation 12/19/2020   Bipolar II disorder (HCC) 12/19/2020    Ted Mcalpine, OT 12/19/2023, 9:57 AM  Kerrin Champagne, OT    [facility] HOSPITALIZATION PROGRAM [address] [place], [place], [address] Phone: [phone]   Fax:  [phone]  Name: Donna Wagner MRN: [id number] Date of Birth: [birth year]

## 2023-12-22 ENCOUNTER — Encounter (HOSPITAL_COMMUNITY): Payer: Self-pay

## 2023-12-22 ENCOUNTER — Other Ambulatory Visit: Payer: Self-pay | Admitting: Nurse Practitioner

## 2023-12-22 ENCOUNTER — Ambulatory Visit: Payer: No Typology Code available for payment source | Admitting: Licensed Clinical Social Worker

## 2023-12-22 ENCOUNTER — Other Ambulatory Visit (HOSPITAL_COMMUNITY): Payer: Self-pay

## 2023-12-22 NOTE — Psych (Signed)
 Virtual Visit via Video Note  I connected with Donna Wagner on 12/19/23 at  9:00 AM EST by a video enabled telemedicine application and verified that I am speaking with the correct person using two identifiers.  Location: Patient: patient home Provider: clinical home office   I discussed the limitations of evaluation and management by telemedicine and the availability of in person appointments. The patient expressed understanding and agreed to proceed.  I discussed the assessment and treatment plan with the patient. The patient was provided an opportunity to ask questions and all were answered. The patient agreed with the plan and demonstrated an understanding of the instructions.   The patient was advised to call back or seek an in-person evaluation if the symptoms worsen or if the condition fails to improve as anticipated.  Pt was provided 240 minutes of non-face-to-face time during this encounter.   [name], LCSW   [facility] BH PHP THERAPIST PROGRESS NOTE  [name] SOZA [phone]  Session Time: 9:00 - 10:00  Participation Level: Active  Behavioral Response: CasualAlertDepressed  Type of Therapy: Group Therapy  Treatment Goals addressed: Coping  Progress Towards Goals: Initial  Interventions: CBT, DBT, Supportive, and Reframing  Summary: Donna Wagner is a 18 y.o. female who presents with depression symptoms.  Clinician led check-in regarding current stressors and situation, and review of patient completed daily inventory. Clinician utilized active listening and empathetic response and validated patient emotions. Clinician facilitated processing group on pertinent issues.   Therapist Response: Patient arrived within time allowed. Patient rates her mood at a 7 on a scale of 1-10 with 10 being best. Pt states she feels "optimistic." Pt states she slept 8 hours and ate 2x. Pt reports she is still "kind of numb" from her breakup but made plans for the day to have  something to look forward to. Pt struggles with rumination. Patient able to process. Patient engaged in discussion.          Session Time: 10:00 am - 11:00 am   Participation Level: Active   Behavioral Response: CasualAlertDepressed   Type of Therapy: Group Therapy   Treatment Goals addressed: Coping   Progress Towards Goals: Progressing   Interventions: CBT, DBT, Solution Focused, Strength-based, Supportive, and Reframing   Summary: [name] led discussion on change. Group members shared ways in which they struggle with change and what makes it difficult. [name] brought in DBT radical acceptance and CBT thought challenging as a way to manage issues with change.    Therapist Response: Pt engaged in discussion.           Session Time: 11:00 -12:00   Participation Level: Active   Behavioral Response: CasualAlertDepressed   Type of Therapy: Group Therapy   Treatment Goals addressed: Coping   Progress Towards Goals: Progressing   Interventions: CBT, DBT, Solution Focused, Strength-based, Supportive, and Reframing   Summary: [name] continued topic of CBT cognitive distortions and introduced thought challenging as a way to  utilize the "challenge" C in C-C-C. Group utilized Administrator, Civil Service questions" as a way to introduce challenges and reframe distorted thinking. Group members worked through pt examples to practice challenging distorted thinking.    Therapist Response:  Pt engaged in discussion and demonstrates understanding of challenging distorted thoughts through practice.              Session Time: 12:00 -1:00   Participation Level: Active   Behavioral Response: CasualAlertDepressed   Type of Therapy: Group therapy   Treatment Goals addressed: Coping  Progress Towards Goals: Progressing   Interventions: OT group   Summary: 12:00 - 12:50: Occupational Therapy group with cln E. Hollan.  12:50 - 1:00 Clinician assessed for immediate needs, medication compliance and  efficacy, and safety concerns.   Therapist Response: 12:00 - 12:50: See note 12:50 - 1:00 pm: At check-out, patient reports no immediate concerns. Patient demonstrates progress as evidenced by continued engagement and responsiveness to treatment. Patient denies SI/HI/self-harm thoughts at the end of group.   Suicidal/Homicidal: Nowithout intent/plan   Plan: Pt will continue in PHP while working to decrease depression symptoms, increase emotion regulation, and increase ability to manage symptoms in a healthy manner.   Collaboration of Care: Medication Management AEB Corrie Mckusick, MD  Patient/Guardian was advised Release of Information must be obtained prior to any record release in order to collaborate their care with an outside provider. Patient/Guardian was advised if they have not already done so to contact the registration department to sign all necessary forms in order for [place] to release information regarding their care.   Consent: Patient/Guardian gives verbal consent for treatment and assignment of benefits for services provided during this visit. Patient/Guardian expressed understanding and agreed to proceed.   Diagnosis: MDD (major depressive disorder), recurrent severe, without psychosis (HCC) [F33.2]    1. MDD (major depressive disorder), recurrent severe, without psychosis (HCC)   2. PTSD (post-traumatic stress disorder)       [name], LCSW

## 2023-12-22 NOTE — Psych (Signed)
 Virtual Visit via Video Note  I connected with Donna Wagner on 12/18/23 at  9:00 AM EST by a video enabled telemedicine application and verified that I am speaking with the correct person using two identifiers.  Location: Patient: patient home Provider: clinical home office   I discussed the limitations of evaluation and management by telemedicine and the availability of in person appointments. The patient expressed understanding and agreed to proceed.  I discussed the assessment and treatment plan with the patient. The patient was provided an opportunity to ask questions and all were answered. The patient agreed with the plan and demonstrated an understanding of the instructions.   The patient was advised to call back or seek an in-person evaluation if the symptoms worsen or if the condition fails to improve as anticipated.  Pt was provided 240 minutes of non-face-to-face time during this encounter.   [name], LCSW   [facility] BH PHP THERAPIST PROGRESS NOTE  RUKAYA KLEINSCHMIDT [id number]  Session Time: 9:00 - 10:00  Participation Level: Active  Behavioral Response: CasualAlertDepressed  Type of Therapy: Group Therapy  Treatment Goals addressed: Coping  Progress Towards Goals: Initial  Interventions: CBT, DBT, Supportive, and Reframing  Summary: Donna Wagner is a 18 y.o. female who presents with depression symptoms.  Clinician led check-in regarding current stressors and situation, and review of patient completed daily inventory. Clinician utilized active listening and empathetic response and validated patient emotions. Clinician facilitated processing group on pertinent issues.   Therapist Response: Patient arrived within time allowed. Patient rates her mood at a 4 on a scale of 1-10 with 10 being best. Pt states she feels "anxiety." Pt states she slept 8 hours and ate 3x. Pt reports her partner broke up with her yesterday. 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 Pt shares her reaction to the breakup is "odd" and typically she would have big emotions. Patient able to process. Patient engaged in discussion.          Session Time: 10:00 am - 11:00 am   Participation Level: Active   Behavioral Response: CasualAlertDepressed   Type of Therapy: Group Therapy   Treatment Goals addressed: Coping   Progress Towards Goals: Progressing   Interventions: CBT, DBT, Solution Focused, Strength-based, Supportive, and Reframing   Summary: [name] led discussion on procrastination. [name] encouraged pt's to consider the feeling that is feeding procrastination and address it as a way of alleviating desire to procrastinate. [name] applied CBT thought challenging and DBT distress tolerance skills to aid discussion.    Therapist Response: Pt engaged in discussion and identifies the root feeling of their procrastination.           Session Time: 11:00 -12:00   Participation Level: Active   Behavioral Response: CasualAlertDepressed   Type of Therapy: Group Therapy   Treatment Goals addressed: Coping   Progress Towards Goals: Progressing   Interventions: CBT, DBT, Solution Focused, Strength-based, Supportive, and Reframing   Summary: [name] continued topic of CBT cognitive distortions. [name] utilized handout "cognitive distortions" to discuss common examples of distorted thoughts and group members worked to identify examples in their own life.    Therapist Response: Pt engaged in discussion and is able to identify which distortions are most problematic.              Session Time: 12:00 -1:00   Participation Level: Active   Behavioral Response: CasualAlertDepressed   Type of Therapy: Group therapy  Treatment Goals addressed: Coping   Progress Towards Goals: Progressing   Interventions: OT group   Summary: 12:00 - 12:50: Occupational Therapy group with cln E. Hollan.  12:50 - 1:00 Clinician  assessed for immediate needs, medication compliance and efficacy, and safety concerns.   Therapist Response: 12:00 - 12:50: See note 12:50 - 1:00 pm: At check-out, patient reports no immediate concerns. Patient demonstrates progress as evidenced by continued engagement and responsiveness to treatment. Patient denies SI/HI/self-harm thoughts at the end of group.   Suicidal/Homicidal: Nowithout intent/plan   Plan: Pt will continue in PHP while working to decrease depression symptoms, increase emotion regulation, and increase ability to manage symptoms in a healthy manner.   Collaboration of Care: Medication Management AEB Corrie Mckusick, MD  Patient/Guardian was advised Release of Information must be obtained prior to any record release in order to collaborate their care with an outside provider. Patient/Guardian was advised if they have not already done so to contact the registration department to sign all necessary forms in order for [place] to release information regarding their care.   Consent: Patient/Guardian gives verbal consent for treatment and assignment of benefits for services provided during this visit. Patient/Guardian expressed understanding and agreed to proceed.   Diagnosis: MDD (major depressive disorder), recurrent severe, without psychosis (HCC) [F33.2]    1. MDD (major depressive disorder), recurrent severe, without psychosis (HCC)   2. PTSD (post-traumatic stress disorder)   3. Borderline personality disorder (HCC)       [name], LCSW

## 2023-12-22 NOTE — Psych (Signed)
 Virtual Visit via Video Note  I connected with Donna Wagner on 12/17/23 at  9:00 AM EST by a video enabled telemedicine application and verified that I am speaking with the correct person using two identifiers.  Location: Patient: patient home Provider: clinical home office   I discussed the limitations of evaluation and management by telemedicine and the availability of in person appointments. The patient expressed understanding and agreed to proceed.  I discussed the assessment and treatment plan with the patient. The patient was provided an opportunity to ask questions and all were answered. The patient agreed with the plan and demonstrated an understanding of the instructions.   The patient was advised to call back or seek an in-person evaluation if the symptoms worsen or if the condition fails to improve as anticipated.  Pt was provided 240 minutes of non-face-to-face time during this encounter.   Donna Guiles, LCSW   [facility] PHP THERAPIST PROGRESS NOTE  MARAKI MACQUARRIE [id number]  Session Time: 9:00 - 10:00  Participation Level: Active  Behavioral Response: CasualAlertDepressed  Type of Therapy: Group Therapy  Treatment Goals addressed: Coping  Progress Towards Goals: Initial  Interventions: CBT, DBT, Supportive, and Reframing  Summary: Donna Wagner is a 18 y.o. female who presents with depression symptoms.  Clinician led check-in regarding current stressors and situation, and review of patient completed daily inventory. Clinician utilized active listening and empathetic response and validated patient emotions. Clinician facilitated processing group on pertinent issues.   Therapist Response: Patient arrived within time allowed. Patient rates her mood at a 5 on a scale of 1-10 with 10 being best. Pt states she feels "anxious." Pt states she slept 11 hours and ate 2x. Pt reports struggles in her current relationship which is causing anxiety and fear. Pt  shares she is in limbo, waiting to talk to her partner to know how things will progress. Pt states she acted on self-harm yesterday. Pt denies needing medical attention and states she acted after an anxiety spiral about her relationship. With support from cln and group, pt is able to determine 3 coping skills to utilize when feeling big emotion in the future and pt commits to trying them first before acting on self harm. Patient able to process. Patient engaged in discussion.          Session Time: 10:00 am - 11:00 am   Participation Level: Active   Behavioral Response: CasualAlertDepressed   Type of Therapy: Group Therapy   Treatment Goals addressed: Coping   Progress Towards Goals: Progressing   Interventions: CBT, DBT, Solution Focused, Strength-based, Supportive, and Reframing   Summary: [name] led processing group for pt's current struggles. Group members shared stressors and provided support and feedback. [name] brought in topics of boundaries, healthy relationships, and unhealthy thought processes to inform discussion.    Therapist Response:  Pt able to process and provide support to group.            Session Time: 11:00 -12:00   Participation Level: Active   Behavioral Response: CasualAlertDepressed   Type of Therapy: Group Therapy   Treatment Goals addressed: Coping   Progress Towards Goals: Progressing   Interventions: Strength-based, Supportive, and Reframing   Summary: Chaplaincy group with K. Claussen   Therapist Response: Pt participated and engaged in discussion.             Session Time: 12:00 -1:00   Participation Level: Active   Behavioral Response: CasualAlertDepressed   Type of Therapy: Group therapy  Treatment Goals addressed: Coping   Progress Towards Goals: Progressing   Interventions: OT group   Summary: 12:00 - 12:50: Occupational Therapy group with cln E. Hollan.  12:50 - 1:00 Clinician assessed for immediate needs, medication  compliance and efficacy, and safety concerns.   Therapist Response: 12:00 - 12:50: See note 12:50 - 1:00 pm: At check-out, patient reports no immediate concerns. Patient demonstrates progress as evidenced by participating in first group session. Patient denies SI/HI/self-harm thoughts at the end of group.   Suicidal/Homicidal: Nowithout intent/plan   Plan: Pt will continue in PHP while working to decrease depression symptoms, increase emotion regulation, and increase ability to manage symptoms in a healthy manner.   Collaboration of Care: Medication Management AEB Corrie Mckusick, MD  Patient/Guardian was advised Release of Information must be obtained prior to any record release in order to collaborate their care with an outside provider. Patient/Guardian was advised if they have not already done so to contact the registration department to sign all necessary forms in order for [place] to release information regarding their care.   Consent: Patient/Guardian gives verbal consent for treatment and assignment of benefits for services provided during this visit. Patient/Guardian expressed understanding and agreed to proceed.   Diagnosis: MDD (major depressive disorder), recurrent severe, without psychosis (HCC) [F33.2]    1. MDD (major depressive disorder), recurrent severe, without psychosis (HCC)   2. Recurrent major depressive disorder, in partial remission (HCC)   3. PTSD (post-traumatic stress disorder)   4. Hx of bipolar disorder   5. Borderline personality disorder (HCC)       Donna Guiles, LCSW

## 2023-12-22 NOTE — Therapy (Signed)
 [facility] PARTIAL HOSPITALIZATION PROGRAM [address] [place], [place], [address] Phone: [phone]   Fax:  [phone]  Occupational Therapy Treatment Virtual Visit via Video Note  I connected with Jenni A Stallings on 12/22/23 at  8:00 AM EST by a video enabled telemedicine application and verified that I am speaking with the correct person using two identifiers.  Location: Patient: home Provider: office   I discussed the limitations of evaluation and management by telemedicine and the availability of in person appointments. The patient expressed understanding and agreed to proceed.    The patient was advised to call back or seek an in-person evaluation if the symptoms worsen or if the condition fails to improve as anticipated.  I provided 55 minutes of non-face-to-face time during this encounter.   Patient Details  Name: Donna Wagner MRN: [id number] Date of Birth: May 29, 2006 No data recorded  Encounter Date: 12/19/2023   OT End of Session - 12/22/23 1134     Visit Number 3    Number of Visits 20    Date for OT Re-Evaluation 01/18/24    OT Start Time 1200    OT Stop Time 1255    OT Time Calculation (min) 55 min    Activity Tolerance Patient tolerated treatment well             Past Medical History:  Diagnosis Date   Anxiety    Bipolar disorder (HCC)    Depression     Past Surgical History:  Procedure Laterality Date   APPENDECTOMY     XI ROBOTIC LAPAROSCOPIC ASSISTED APPENDECTOMY N/A 11/19/2022   Procedure: XI ROBOTIC LAPAROSCOPIC ASSISTED APPENDECTOMY;  Surgeon: Carolan Shiver, MD;  Location: [facility] ORS;  Service: General;  Laterality: N/A;    There were no vitals filed for this visit.   Subjective Assessment - 12/22/23 1134     Currently in Pain? No/denies    Pain Score 0-No pain               Group Session:  S: Feeling better today.   O: In this communication group therapy session, facilitated by  an occupational therapist, participants explored several key subtopics aimed at enhancing their interpersonal skills. The session began with a discussion on the use of "I" and "AND" statements, emphasizing personal responsibility and constructive language in expressing feelings and needs. Participants then practiced active listening techniques to improve their ability to fully understand and respond to others. The group also delved into assertive communication, learning how to express themselves confidently and respectfully. Emotional regulation skills were addressed, providing strategies for managing emotions during interactions. Social skills training included role-playing scenarios to build confidence in various social contexts. Feedback and reflection were integral parts of the session, with participants offering and receiving constructive feedback to foster personal growth. The group identified common barriers to effective communication, such as anxiety, fear of judgment, and past negative experiences, and discussed strategies to overcome these challenges, including mindfulness practices and building a supportive network.   A: The patient actively participated in the group therapy session, demonstrating a high level of engagement and enthusiasm. They contributed to discussions on "I" and "AND" statements by sharing personal examples and insights. During the active listening exercises, the patient was attentive and provided thoughtful feedback to peers. They effectively practiced assertive communication techniques and showed a clear understanding of emotional regulation strategies. In social skills role-playing, the patient was confident and responsive, indicating a good grasp of the concepts. The patient  also engaged in the feedback and reflection segment, offering constructive comments and showing receptivity to feedback from others. Their proactive approach and willingness to explore personal barriers to  communication suggest significant progress and motivation to improve interpersonal skills.    P: Continue to attend PHP OT group sessions 5x week for 4 weeks to promote daily structure, social engagement, and opportunities to develop and utilize adaptive strategies to maximize functional performance in preparation for safe transition and integration back into school, work, and the community. Plan to address topic of pt 3 in next OT group session.                    OT Education - 12/22/23 1134     Education Details Communication 2              OT Short Term Goals - 12/19/23 0955       OT SHORT TERM GOAL #1   Title Patient will be educated on strategies to improve psychosocial skills needed to participate fully in all daily, work, and leisure activities.    Time 4    Period Weeks    Status On-going    Target Date 01/18/24      OT SHORT TERM GOAL #2   Title Pt will apply psychosocial skills and coping mechanisms to daily activities in order to function independently and reintegrate into community dwelling    Status On-going      OT SHORT TERM GOAL #3   Title Pt will choose and/or engage in 1-3 socially engaging leisure activities to improve social participation skills upon reintegrating into community    Status On-going      OT SHORT TERM GOAL #4   Title Patient will be educated on strategies to improve psychosocial skills needed to participate fully in all daily, work, and leisure activities.    Status On-going                      Plan - 12/22/23 1134     Psychosocial Skills Coping Strategies;Habits             Patient will benefit from skilled therapeutic intervention in order to improve the following deficits and impairments:       Psychosocial Skills: Coping Strategies, Habits   Visit Diagnosis: Difficulty coping    Problem List Patient Active Problem List   Diagnosis Date Noted   Generalized anxiety disorder 12/15/2023    Social anxiety disorder 10/28/2023   Recurrent major depressive disorder, in partial remission (HCC) 10/28/2023   Hx of bipolar disorder 10/28/2023   Long term current use of cannabis 10/28/2023   High risk medication use 10/28/2023   Vitamin D deficiency 08/22/2023   MDD (major depressive disorder), recurrent severe, without psychosis (HCC) 08/10/2023   Major depressive disorder, recurrent episode with mixed features (HCC) 08/03/2023   Anxiety and depression 05/29/2023   Sexual dysfunction 05/29/2023   PTSD (post-traumatic stress disorder) 05/15/2023   Nightmares 05/15/2023   Reactive depression 05/15/2023   Acute appendicitis with localized peritonitis 11/19/2022   MDD (major depressive disorder), single episode, severe , no psychosis (HCC) 12/19/2020   Overdose 12/19/2020   Suicide ideation 12/19/2020   Bipolar II disorder (HCC) 12/19/2020    Ted Mcalpine, OT 12/22/2023, 11:35 AM  Kerrin Champagne, OT   [facility] HOSPITALIZATION PROGRAM [address] [place], [place], [address] Phone: [phone]   Fax:  [phone]  Name: Alfred A  Odell MRN: [id number] Date of Birth: 2006-10-11

## 2023-12-23 ENCOUNTER — Ambulatory Visit: Payer: Self-pay | Admitting: Psychiatry

## 2023-12-24 ENCOUNTER — Other Ambulatory Visit (HOSPITAL_COMMUNITY): Payer: Self-pay

## 2023-12-24 ENCOUNTER — Other Ambulatory Visit (HOSPITAL_COMMUNITY): Payer: Self-pay | Attending: Psychiatry | Admitting: Licensed Clinical Social Worker

## 2023-12-24 DIAGNOSIS — F603 Borderline personality disorder: Secondary | ICD-10-CM | POA: Diagnosis not present

## 2023-12-24 DIAGNOSIS — F3341 Major depressive disorder, recurrent, in partial remission: Secondary | ICD-10-CM | POA: Diagnosis present

## 2023-12-24 DIAGNOSIS — F4310 Post-traumatic stress disorder, unspecified: Secondary | ICD-10-CM | POA: Diagnosis not present

## 2023-12-24 DIAGNOSIS — R4589 Other symptoms and signs involving emotional state: Secondary | ICD-10-CM

## 2023-12-24 DIAGNOSIS — F332 Major depressive disorder, recurrent severe without psychotic features: Secondary | ICD-10-CM | POA: Diagnosis not present

## 2023-12-24 DIAGNOSIS — F419 Anxiety disorder, unspecified: Secondary | ICD-10-CM

## 2023-12-24 MED ORDER — GABAPENTIN 100 MG PO CAPS
100.0000 mg | ORAL_CAPSULE | Freq: Two times a day (BID) | ORAL | 0 refills | Status: DC
Start: 2023-12-24 — End: 2024-01-20
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
## 2023-12-25 ENCOUNTER — Other Ambulatory Visit (HOSPITAL_COMMUNITY): Payer: Self-pay | Admitting: Licensed Clinical Social Worker

## 2023-12-25 ENCOUNTER — Other Ambulatory Visit (HOSPITAL_COMMUNITY): Payer: Self-pay

## 2023-12-25 DIAGNOSIS — F332 Major depressive disorder, recurrent severe without psychotic features: Secondary | ICD-10-CM

## 2023-12-25 DIAGNOSIS — R4589 Other symptoms and signs involving emotional state: Secondary | ICD-10-CM

## 2023-12-25 DIAGNOSIS — F603 Borderline personality disorder: Secondary | ICD-10-CM

## 2023-12-26 ENCOUNTER — Encounter (HOSPITAL_COMMUNITY): Payer: Self-pay

## 2023-12-26 ENCOUNTER — Other Ambulatory Visit (HOSPITAL_COMMUNITY): Payer: Self-pay | Admitting: Licensed Clinical Social Worker

## 2023-12-26 ENCOUNTER — Other Ambulatory Visit (HOSPITAL_COMMUNITY): Payer: Self-pay

## 2023-12-26 DIAGNOSIS — F332 Major depressive disorder, recurrent severe without psychotic features: Secondary | ICD-10-CM

## 2023-12-26 NOTE — Progress Notes (Signed)
 Attempted to see patient in PHP today but patient was unavailable to be seen. Had to leave group.
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
## 2023-12-26 NOTE — Psych (Signed)
 Virtual Visit via Video Note  I connected with Donna Wagner on 12/24/23 at  9:00 AM EST by a video enabled telemedicine application and verified that I am speaking with the correct person using two identifiers.  Location: Patient: patient home Provider: clinical home office   I discussed the limitations of evaluation and management by telemedicine and the availability of in person appointments. The patient expressed understanding and agreed to proceed.  I discussed the assessment and treatment plan with the patient. The patient was provided an opportunity to ask questions and all were answered. The patient agreed with the plan and demonstrated an understanding of the instructions.   The patient was advised to call back or seek an in-person evaluation if the symptoms worsen or if the condition fails to improve as anticipated.  Pt was provided 240 minutes of non-face-to-face time during this encounter.   [name], LCSW   [facility] PHP THERAPIST PROGRESS NOTE  Donna Wagner [id number]  Session Time: 9:00 - 10:00  Participation Level: Active  Behavioral Response: CasualAlertDepressed  Type of Therapy: Group Therapy  Treatment Goals addressed: Coping  Progress Towards Goals: Progressing  Interventions: CBT, DBT, Supportive, and Reframing  Summary: Donna Wagner is a 18 y.o. female who presents with depression symptoms.  Clinician led check-in regarding current stressors and situation, and review of patient completed daily inventory. Clinician utilized active listening and empathetic response and validated patient emotions. Clinician facilitated processing group on pertinent issues.   Therapist Response: Patient arrived within time allowed. Patient rates her mood at a 5 on a scale of 1-10 with 10 being best. Pt states she feels "like a failure." Pt states she slept 5 hours and ate 2x. Pt reports she had her first shift at a new job last night and did not make it  through. Pt states feeling over-stimulated and experiencing negative self talk that began before the shift. Pt states she had a panic attack on her break and did not return. Pt reports experiencing SI during that time and being able to talk her way out of acting. Pt denies current SI. Patient able to process. Patient engaged in discussion.          Session Time: 10:00 am - 11:00 am   Participation Level: Active   Behavioral Response: CasualAlertDepressed   Type of Therapy: Group Therapy   Treatment Goals addressed: Coping   Progress Towards Goals: Progressing   Interventions: CBT, DBT, Solution Focused, Strength-based, Supportive, and Reframing   Summary: [name] led processing group for pt's current struggles. Group members shared stressors and provided support and feedback. [name] brought in topics of boundaries, healthy relationships, and unhealthy thought processes to inform discussion.    Therapist Response:  Pt able to process and provide support to group.            Session Time: 11:00 -12:00   Participation Level: Active   Behavioral Response: CasualAlertDepressed   Type of Therapy: Group Therapy   Treatment Goals addressed: Coping   Progress Towards Goals: Progressing   Interventions: Strength-based, Supportive, and Reframing   Summary: Chaplaincy group with K. Claussen   Therapist Response: Pt participated and engaged in discussion.             Session Time: 12:00 -1:00   Participation Level: Active   Behavioral Response: CasualAlertDepressed   Type of Therapy: Group therapy   Treatment Goals addressed: Coping   Progress Towards Goals: Progressing   Interventions: OT group   Summary:  12:00 - 12:50: Occupational Therapy group with cln E. Hollan.  12:50 - 1:00 Clinician assessed for immediate needs, medication compliance and efficacy, and safety concerns.   Therapist Response: 12:00 - 12:50: See note 12:50 - 1:00 pm: At check-out, patient reports no  immediate concerns. Patient demonstrates progress as evidenced by continued engagement and responsiveness to treatment. Patient denies SI/HI/self-harm thoughts at the end of group.   Suicidal/Homicidal: Nowithout intent/plan   Plan: Pt will continue in PHP while working to decrease depression symptoms, increase emotion regulation, and increase ability to manage symptoms in a healthy manner.   Collaboration of Care: Medication Management AEB Corrie Mckusick, MD  Patient/Guardian was advised Release of Information must be obtained prior to any record release in order to collaborate their care with an outside provider. Patient/Guardian was advised if they have not already done so to contact the registration department to sign all necessary forms in order for [place] to release information regarding their care.   Consent: Patient/Guardian gives verbal consent for treatment and assignment of benefits for services provided during this visit. Patient/Guardian expressed understanding and agreed to proceed.   Diagnosis: MDD (major depressive disorder), recurrent severe, without psychosis (HCC) [F33.2]    1. MDD (major depressive disorder), recurrent severe, without psychosis (HCC)   2. PTSD (post-traumatic stress disorder)   3. Borderline personality disorder (HCC)   4. Anxiety disorder, unspecified type       [name], LCSW

## 2023-12-27 NOTE — Psych (Signed)
 Virtual Visit via Video Note  I connected with Donna Wagner on 12/25/23 at  9:00 AM EST by a video enabled telemedicine application and verified that I am speaking with the correct person using two identifiers.  Location: Patient: patient home Provider: clinical home office   I discussed the limitations of evaluation and management by telemedicine and the availability of in person appointments. The patient expressed understanding and agreed to proceed.  I discussed the assessment and treatment plan with the patient. The patient was provided an opportunity to ask questions and all were answered. The patient agreed with the plan and demonstrated an understanding of the instructions.   The patient was advised to call back or seek an in-person evaluation if the symptoms worsen or if the condition fails to improve as anticipated.  Pt was provided 240 minutes of non-face-to-face time during this encounter.   Donna Guiles, LCSW   [facility] PHP THERAPIST PROGRESS NOTE  Donna Wagner [id number]  Session Time: 9:00 - 10:00  Participation Level: Active  Behavioral Response: CasualAlertDepressed  Type of Therapy: Group Therapy  Treatment Goals addressed: Coping  Progress Towards Goals: Progressing  Interventions: CBT, DBT, Supportive, and Reframing  Summary: Donna Wagner is a 18 y.o. female who presents with depression symptoms.  Clinician led check-in regarding current stressors and situation, and review of patient completed daily inventory. Clinician utilized active listening and empathetic response and validated patient emotions. Clinician facilitated processing group on pertinent issues.   Therapist Response: Patient arrived within time allowed. Patient rates her mood at a 6 on a scale of 1-10 with 10 being best. Pt states she feels "kind of depressed." Pt states she slept 6 hours and ate 3x. Pt reports she stayed in bed most of the day and slept to deal with the  feelings. Pt states later in the evening she crocheted to calm herself. Pt struggles with avoidance and acknowledging her feelings. Patient able to process. Patient engaged in discussion.          Session Time: 10:00 am - 11:00 am   Participation Level: Active   Behavioral Response: CasualAlertDepressed   Type of Therapy: Group Therapy   Treatment Goals addressed: Coping   Progress Towards Goals: Progressing   Interventions: CBT, DBT, Solution Focused, Strength-based, Supportive, and Reframing   Summary: [name] led discussion on unrealistic expectations and the ways expectations can alter perspective. Group members discussed feelings and situations that have occurred due to expectation and how to know if they are unrealistic. [name] brought in CBT thought challenging and reframing to aid discussion.    Therapist Response: Pt engaged in discussion and reports gaining insight.            Session Time: 11:00 -12:00   Participation Level: Active   Behavioral Response: CasualAlertDepressed   Type of Therapy: Group Therapy   Treatment Goals addressed: Coping   Progress Towards Goals: Progressing   Interventions: CBT, DBT, Solution Focused, Strength-based, Supportive, and Reframing   Summary: [name] continued topic of boundaries. [name] discussed the different ways boundaries present: physical, emotional, intellectual, sexual, material, and time. Group talked about the ways in which each type presents for them and is a struggle.    Therapist Response: Pt engaged in discussion and identified struggles with each type.             Session Time: 12:00 -1:00   Participation Level: Active   Behavioral Response: CasualAlertDepressed   Type of Therapy: Group therapy   Treatment  Goals addressed: Coping   Progress Towards Goals: Progressing   Interventions: OT group   Summary: 12:00 - 12:50: Occupational Therapy group with cln E. Hollan.  12:50 - 1:00 Clinician assessed for  immediate needs, medication compliance and efficacy, and safety concerns.   Therapist Response: 12:00 - 12:50: See note 12:50 - 1:00 pm: At check-out, patient reports no immediate concerns. Patient demonstrates progress as evidenced by continued engagement and responsiveness to treatment. Patient denies SI/HI/self-harm thoughts at the end of group.   Suicidal/Homicidal: Nowithout intent/plan   Plan: Pt will continue in PHP while working to decrease depression symptoms, increase emotion regulation, and increase ability to manage symptoms in a healthy manner.   Collaboration of Care: Medication Management AEB Corrie Mckusick, MD  Patient/Guardian was advised Release of Information must be obtained prior to any record release in order to collaborate their care with an outside provider. Patient/Guardian was advised if they have not already done so to contact the registration department to sign all necessary forms in order for [place] to release information regarding their care.   Consent: Patient/Guardian gives verbal consent for treatment and assignment of benefits for services provided during this visit. Patient/Guardian expressed understanding and agreed to proceed.   Diagnosis: MDD (major depressive disorder), recurrent severe, without psychosis (HCC) [F33.2]    1. MDD (major depressive disorder), recurrent severe, without psychosis (HCC)   2. Borderline personality disorder (HCC)       Donna Guiles, LCSW

## 2023-12-29 ENCOUNTER — Other Ambulatory Visit (HOSPITAL_COMMUNITY): Payer: Self-pay | Admitting: Licensed Clinical Social Worker

## 2023-12-29 ENCOUNTER — Other Ambulatory Visit (HOSPITAL_COMMUNITY): Payer: Self-pay | Attending: Psychiatry

## 2023-12-29 DIAGNOSIS — F332 Major depressive disorder, recurrent severe without psychotic features: Secondary | ICD-10-CM

## 2023-12-29 DIAGNOSIS — R4589 Other symptoms and signs involving emotional state: Secondary | ICD-10-CM | POA: Insufficient documentation

## 2023-12-29 DIAGNOSIS — F603 Borderline personality disorder: Secondary | ICD-10-CM | POA: Insufficient documentation

## 2023-12-30 ENCOUNTER — Other Ambulatory Visit (HOSPITAL_COMMUNITY): Payer: Self-pay

## 2023-12-30 ENCOUNTER — Encounter (HOSPITAL_COMMUNITY): Payer: Self-pay

## 2023-12-30 ENCOUNTER — Other Ambulatory Visit (HOSPITAL_COMMUNITY): Payer: Self-pay | Admitting: Licensed Clinical Social Worker

## 2023-12-30 DIAGNOSIS — R4589 Other symptoms and signs involving emotional state: Secondary | ICD-10-CM

## 2023-12-30 DIAGNOSIS — F332 Major depressive disorder, recurrent severe without psychotic features: Secondary | ICD-10-CM

## 2023-12-30 DIAGNOSIS — F603 Borderline personality disorder: Secondary | ICD-10-CM

## 2023-12-30 NOTE — Therapy (Signed)
 [facility] PARTIAL HOSPITALIZATION PROGRAM [address] [place], [place], [address] Phone: [phone]   Fax:  [phone]  Occupational Therapy Treatment Virtual Visit via Video Note  I connected with Donna Wagner on 12/30/23 at  8:00 AM EST by a video enabled telemedicine application and verified that I am speaking with the correct person using two identifiers.  Location: Patient: home Provider: office   I discussed the limitations of evaluation and management by telemedicine and the availability of in person appointments. The patient expressed understanding and agreed to proceed.    The patient was advised to call back or seek an in-person evaluation if the symptoms worsen or if the condition fails to improve as anticipated.  I provided 55 minutes of non-face-to-face time during this encounter.   Patient Details  Name: Donna Wagner MRN: [id number] Date of Birth: Mar 07, 2006 No data recorded  Encounter Date: 12/25/2023   OT End of Session - 12/30/23 6962     Visit Number 5    Number of Visits 20    Date for OT Re-Evaluation 01/18/24    OT Start Time 1200    OT Stop Time 1255    OT Time Calculation (min) 55 min             Past Medical History:  Diagnosis Date   Anxiety    Bipolar disorder (HCC)    Depression     Past Surgical History:  Procedure Laterality Date   APPENDECTOMY     XI ROBOTIC LAPAROSCOPIC ASSISTED APPENDECTOMY N/A 11/19/2022   Procedure: XI ROBOTIC LAPAROSCOPIC ASSISTED APPENDECTOMY;  Surgeon: Carolan Shiver, MD;  Location: [facility] ORS;  Service: General;  Laterality: N/A;    There were no vitals filed for this visit.   Subjective Assessment - 12/30/23 0922     Currently in Pain? No/denies    Pain Score 0-No pain                 Group Session:  S: Doing good today.   O: The objective of today's group is to provide a comprehensive understanding of the concept of "motivation" and its  role in human behavior and well-being. The content covers various theories of motivation, including intrinsic and extrinsic motivators, and explores the psychological mechanisms that drive individuals to achieve goals, overcome obstacles, and make decisions. By diving into real-world applications, the group aims to offer actionable strategies for enhancing motivation in different life domains, such as work, relationships, and personal growth.  Utilizing a multi-disciplinary approach, this group integrates insights from psychology, neuroscience, and behavioral economics to present a holistic view of motivation. The objective is not only to educate the audience about the complexities and driving forces behind motivation but also to equip them with practical tools and techniques to improve their own motivation levels. By the end of this multi-day group, patient's should have a well-rounded understanding of what motivates human actions and how to harness this knowledge for personal and professional betterment.   A: The patient demonstrates a high level of engagement during the session, actively participating in discussions about motivation theories and their applicability to their own life. They show keen interest in learning new strategies to improve their motivation and even offer examples from their own experiences that align with the theories presented. Their level of self-awareness and willingness to invest in self-improvement suggest that they are well-positioned to benefit from the practical tools and techniques discussed. The patient's ability to articulate their goals and  challenges further supports the likelihood of successfully implementing the strategies presented.    P: Continue to attend PHP OT group sessions 5x week for 4 weeks to promote daily structure, social engagement, and opportunities to develop and utilize adaptive strategies to maximize functional performance in preparation for safe  transition and integration back into school, work, and the community. Plan to address topic of pt 4 in next OT group session.                  OT Education - 12/30/23 0922     Education Details Motivation 3              OT Short Term Goals - 12/19/23 0955       OT SHORT TERM GOAL #1   Title Patient will be educated on strategies to improve psychosocial skills needed to participate fully in all daily, work, and leisure activities.    Time 4    Period Weeks    Status On-going    Target Date 01/18/24      OT SHORT TERM GOAL #2   Title Pt will apply psychosocial skills and coping mechanisms to daily activities in order to function independently and reintegrate into community dwelling    Status On-going      OT SHORT TERM GOAL #3   Title Pt will choose and/or engage in 1-3 socially engaging leisure activities to improve social participation skills upon reintegrating into community    Status On-going      OT SHORT TERM GOAL #4   Title Patient will be educated on strategies to improve psychosocial skills needed to participate fully in all daily, work, and leisure activities.    Status On-going                      Plan - 12/30/23 0923     Psychosocial Skills Coping Strategies;Habits             Patient will benefit from skilled therapeutic intervention in order to improve the following deficits and impairments:       Psychosocial Skills: Coping Strategies, Habits   Visit Diagnosis: Difficulty coping    Problem List Patient Active Problem List   Diagnosis Date Noted   Generalized anxiety disorder 12/15/2023   Social anxiety disorder 10/28/2023   Recurrent major depressive disorder, in partial remission (HCC) 10/28/2023   Hx of bipolar disorder 10/28/2023   Long term current use of cannabis 10/28/2023   High risk medication use 10/28/2023   Vitamin D deficiency 08/22/2023   MDD (major depressive disorder), recurrent severe, without  psychosis (HCC) 08/10/2023   Major depressive disorder, recurrent episode with mixed features (HCC) 08/03/2023   Anxiety and depression 05/29/2023   Sexual dysfunction 05/29/2023   PTSD (post-traumatic stress disorder) 05/15/2023   Nightmares 05/15/2023   Reactive depression 05/15/2023   Acute appendicitis with localized peritonitis 11/19/2022   MDD (major depressive disorder), single episode, severe , no psychosis (HCC) 12/19/2020   Overdose 12/19/2020   Suicide ideation 12/19/2020   Bipolar II disorder (HCC) 12/19/2020    Ted Mcalpine, OT 12/30/2023, 9:23 AM  Kerrin Champagne, OT   [facility] HOSPITALIZATION PROGRAM [address] [place], [place], [address] Phone: [phone]   Fax:  [phone]  Name: Donna Wagner MRN: [id number] Date of Birth: 2006/05/07

## 2023-12-31 ENCOUNTER — Other Ambulatory Visit (HOSPITAL_COMMUNITY): Payer: Self-pay | Admitting: Licensed Clinical Social Worker

## 2023-12-31 ENCOUNTER — Encounter (HOSPITAL_COMMUNITY): Payer: Self-pay

## 2023-12-31 ENCOUNTER — Other Ambulatory Visit (HOSPITAL_COMMUNITY): Payer: Self-pay

## 2023-12-31 DIAGNOSIS — F332 Major depressive disorder, recurrent severe without psychotic features: Secondary | ICD-10-CM

## 2023-12-31 DIAGNOSIS — F603 Borderline personality disorder: Secondary | ICD-10-CM | POA: Insufficient documentation

## 2023-12-31 DIAGNOSIS — R4589 Other symptoms and signs involving emotional state: Secondary | ICD-10-CM
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
## 2023-12-31 NOTE — Psych (Signed)
 Virtual Visit via Video Note  I connected with Donna Wagner on 12/31/23 at  9:00 AM EDT by a video enabled telemedicine application and verified that I am speaking with the correct person using two identifiers.  Location: Patient: pt's home in [place], [place] Provider: clinical home office in [place], [place]   I discussed the limitations of evaluation and management by telemedicine and the availability of in person appointments. The patient expressed understanding and agreed to proceed.   I discussed the assessment and treatment plan with the patient. The patient was provided an opportunity to ask questions and all were answered. The patient agreed with the plan and demonstrated an understanding of the instructions.   The patient was advised to call back or seek an in-person evaluation if the symptoms worsen or if the condition fails to improve as anticipated.  I provided 240 minutes of non-face-to-face time during this encounter.   Donna Lora, LCSW   [facility] PHP THERAPIST PROGRESS NOTE  Donna Wagner [id number]   Session Time: 9:00 am - 10:00 am  Participation Level: Active  Behavioral Response: CasualAlertDepressed  Type of Therapy: Group Therapy  Treatment Goals addressed: Coping  Progress Towards Goals: Progressing  Interventions: CBT, DBT, Solution Focused, Strength-based, Supportive, and Reframing  Therapist Response: Clinician led check-in regarding current stressors and situation, and review of patient completed daily inventory. Clinician utilized active listening and empathetic response and validated patient emotions. Clinician facilitated processing group on pertinent issues.?   Summary: Patient arrived within time allowed. Patient rates her mood at a 5 on a scale of 1-10 with 10 being best. Pt reported, "I just woke up 15 minutes ago, so I haven't had much to go off of yet today." She reports she slept pretty well, about 10 hours. She reports she ate  well yesterday and relaxed yesterday and crocheted. She denies SI/SH thoughts and is agreeable to discussing guilt and shame during processing. Pt able to process.?Pt engaged in discussion.?      Session Time: 10:00 am - 11:00 am  Participation Level: Active  Behavioral Response: CasualAlertDepressed  Type of Therapy: Group Therapy  Treatment Goals addressed: Coping  Progress Towards Goals: Progressing  Interventions: CBT, DBT, Solution Focused, Strength-based, Supportive, and Reframing  Therapist Response: Clinician led group on shame and guilt. Clinician provided psychoeducation on the physiological effects of shame and invited patients to share their thoughts and feelings related to guilt and shame. Clinician utilized CBT principles to inform discussion.    Summary: Pt engaged in discussion. Pt shares that she feels shame regarding her struggles with her mental health and how it has impacted her family. She is receptive to feedback.    Session Time: 11:00 am - 12:00 pm  Participation Level: Active  Behavioral Response: CasualAlertDepressed  Type of Therapy: Group Therapy  Treatment Goals addressed: Coping  Progress Towards Goals: Progressing  Interventions: CBT, DBT, Solution Focused, Strength-based, Supportive, and Reframing  Therapist Response: Chaplain, Felipe Drone, led group on self-care.   Summary: Pt engaged in discussion.    Session Time: 12:00 pm - 1:00 pm  Participation Level: Active  Behavioral Response: CasualAlertDepressed  Type of Therapy: Group Therapy  Treatment Goals addressed: Coping  Progress Towards Goals: Progressing  Interventions: CBT, DBT, Solution Focused, Strength-based, Supportive, and Reframing  Therapist Response: 12:00 - 12:50 pm: Group was led by occupational therapist, Kerrin Champagne. 12:50 - 1:00 pm: Clinician led check-out. Clinician assessed for immediate needs, medication compliance and efficacy, and safety  concerns?  Summary: 12:00 - 12:50 pm: Pt engaged and participated in discussion. 12:50 - 1:00 pm: At check-out, patient contracts for safety.?Patient demonstrates progress as evidenced by her continued engagement and by being receptive to treatment. Patient denies SI/HI/self-harm thoughts at the end of group and agrees to seek help should those thoughts/feelings occur.?   Suicidal/Homicidal: Nowithout intent/plan  Plan: ?Pt will continue in PHP and medication management while continuing to work on decreasing depression symptoms,?SI, and anxiety symptoms,?and increasing the ability to self manage symptoms.    Collaboration of Care: Medication Management AEB Hillery Jacks, NP and Park Pope, MD  Patient/Guardian was advised Release of Information must be obtained prior to any record release in order to collaborate their care with an outside provider. Patient/Guardian was advised if they have not already done so to contact the registration department to sign all necessary forms in order for [place] to release information regarding their care.   Consent: Patient/Guardian gives verbal consent for treatment and assignment of benefits for services provided during this visit. Patient/Guardian expressed understanding and agreed to proceed.   Diagnosis: MDD (major depressive disorder), recurrent severe, without psychosis (HCC) [F33.2]    1. MDD (major depressive disorder), recurrent severe, without psychosis (HCC)   2. Borderline personality disorder [facility])       Donna Lora, LCSW 12/31/2023

## 2023-12-31 NOTE — Progress Notes (Signed)
 Spoke with patient via Teams video call, used 2 identifiers to correctly identify patient. States that her therapist recommended PHP after she wrote a suicide note a month ago but did not act on it. She had a break up and has no current job for income. Has had 2 previous inpatient visits with the last one being October 2024. Does not feel suicidal and states she was crying after writing her note and decided she needed to get help. On scale 1-10 as 10 being worst she rates depression at 3 and anxiety at 3. Denies SI/HI or AV hallucinations. PHQ9=10. No side effects from medication except she does feel some drowsiness with Neurontin but it does not affect her daily activities. No issues or complaints.

## 2024-01-01 ENCOUNTER — Other Ambulatory Visit (HOSPITAL_COMMUNITY): Payer: Self-pay | Admitting: Licensed Clinical Social Worker

## 2024-01-01 ENCOUNTER — Other Ambulatory Visit (HOSPITAL_COMMUNITY): Payer: Self-pay

## 2024-01-01 DIAGNOSIS — F332 Major depressive disorder, recurrent severe without psychotic features: Secondary | ICD-10-CM

## 2024-01-01 DIAGNOSIS — R4589 Other symptoms and signs involving emotional state: Secondary | ICD-10-CM

## 2024-01-01 DIAGNOSIS — F603 Borderline personality disorder: Secondary | ICD-10-CM
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
## 2024-01-02 ENCOUNTER — Telehealth (HOSPITAL_COMMUNITY): Payer: Self-pay | Admitting: Licensed Clinical Social Worker

## 2024-01-02 ENCOUNTER — Other Ambulatory Visit (HOSPITAL_COMMUNITY): Payer: Self-pay | Admitting: Professional

## 2024-01-02 ENCOUNTER — Other Ambulatory Visit (HOSPITAL_COMMUNITY): Payer: Self-pay

## 2024-01-02 DIAGNOSIS — F332 Major depressive disorder, recurrent severe without psychotic features: Secondary | ICD-10-CM

## 2024-01-02 DIAGNOSIS — F603 Borderline personality disorder: Secondary | ICD-10-CM

## 2024-01-02 DIAGNOSIS — R4589 Other symptoms and signs involving emotional state: Secondary | ICD-10-CM

## 2024-01-04 ENCOUNTER — Encounter (HOSPITAL_COMMUNITY): Payer: Self-pay

## 2024-01-04 NOTE — Therapy (Signed)
 [facility] PARTIAL HOSPITALIZATION PROGRAM [address] [place], [place], [address] Phone: [phone]   Fax:  [phone]  Occupational Therapy Treatment Virtual Visit via Video Note  I connected with Donna Wagner on 01/04/24 at  9:00 AM EDT by a video enabled telemedicine application and verified that I am speaking with the correct person using two identifiers.  Location: Patient: home Provider:  office   I discussed the limitations of evaluation and management by telemedicine and the availability of in person appointments. The patient expressed understanding and agreed to proceed.    The patient was advised to call back or seek an in-person evaluation if the symptoms worsen or if the condition fails to improve as anticipated.  I provided 55 minutes of non-face-to-face time during this encounter.   Patient Details  Name: Donna Wagner MRN: [id number] Date of Birth: 01-Jun-2006 No data recorded  Encounter Date: 12/30/2023   OT End of Session - 01/04/24 1947     Visit Number 7    Number of Visits 20    Date for OT Re-Evaluation 01/18/24    OT Start Time 1200    OT Stop Time 1255    OT Time Calculation (min) 55 min             Past Medical History:  Diagnosis Date   Anxiety    Bipolar disorder (HCC)    Depression     Past Surgical History:  Procedure Laterality Date   APPENDECTOMY     XI ROBOTIC LAPAROSCOPIC ASSISTED APPENDECTOMY N/A 11/19/2022   Procedure: XI ROBOTIC LAPAROSCOPIC ASSISTED APPENDECTOMY;  Surgeon: Carolan Shiver, MD;  Location: [facility] ORS;  Service: General;  Laterality: N/A;    There were no vitals filed for this visit.   Subjective Assessment - 01/04/24 1946     Currently in Pain? No/denies    Pain Score 0-No pain                 Group Session:  S: Feeling better this morning   O: The primary objective of this topic is to explore and understand the concept of occupational balance in  the context of daily living. The term "occupational balance" is defined broadly, encompassing all activities that occupy an individual's time and energy, including self-care, leisure, and work-related tasks. The goal is to guide participants towards achieving a harmonious blend of these activities, tailored to their personal values and life circumstances. This balance is aimed at enhancing overall well-being, not by equally distributing time across activities, but by ensuring that daily engagements are fulfilling and not draining. The content delves into identifying various barriers that individuals face in achieving occupational balance, such as overcommitment, misaligned priorities, external pressures, and lack of effective time management. The impact of these barriers on occupational performance, roles, and lifestyles is examined, highlighting issues like reduced efficiency, strained relationships, and potential health problems. Strategies for cultivating occupational balance are a key focus. These strategies include practical methods like time blocking, prioritizing tasks, establishing self-care rituals, decluttering, connecting with nature, and engaging in reflective practices. These approaches are designed to be adaptable and applicable to a wide range of life scenarios, promoting a proactive and mindful approach to daily living. The overall aim is to equip participants with the knowledge and tools to create a balanced lifestyle that supports their mental, emotional, and physical health, thereby improving their functional performance in daily life.   A:  The patient demonstrated a high level of engagement and  active participation throughout the session on occupational balance. The patient frequently contributed to discussions, offering insightful reflections on personal experiences related to the barriers and strategies for achieving occupational balance. There was a clear understanding of the concept  and an ability to relate it to their own life. The patient showed enthusiasm in learning and applying the strategies discussed, such as time blocking and self-care rituals, indicating a strong motivation to improve their occupational balance. The patient's proactive approach and responsiveness to the topic suggest a high potential for implementing these strategies effectively in their daily routine.   P: Continue to attend PHP OT group sessions 5x week for 4 weeks to promote daily structure, social engagement, and opportunities to develop and utilize adaptive strategies to maximize functional performance in preparation for safe transition and integration back into school, work, and the community. Plan to address topic of pt 3 in next OT group session.                  OT Education - 01/04/24 1946     Education Details Occupational Balance 2              OT Short Term Goals - 12/19/23 0955       OT SHORT TERM GOAL #1   Title Patient will be educated on strategies to improve psychosocial skills needed to participate fully in all daily, work, and leisure activities.    Time 4    Period Weeks    Status On-going    Target Date 01/18/24      OT SHORT TERM GOAL #2   Title Pt will apply psychosocial skills and coping mechanisms to daily activities in order to function independently and reintegrate into community dwelling    Status On-going      OT SHORT TERM GOAL #3   Title Pt will choose and/or engage in 1-3 socially engaging leisure activities to improve social participation skills upon reintegrating into community    Status On-going      OT SHORT TERM GOAL #4   Title Patient will be educated on strategies to improve psychosocial skills needed to participate fully in all daily, work, and leisure activities.    Status On-going                      Plan - 01/04/24 1947     Psychosocial Skills Coping Strategies;Habits             Patient will benefit  from skilled therapeutic intervention in order to improve the following deficits and impairments:       Psychosocial Skills: Coping Strategies, Habits   Visit Diagnosis: Difficulty coping    Problem List Patient Active Problem List   Diagnosis Date Noted   Borderline personality disorder (HCC) 12/31/2023   Generalized anxiety disorder 12/15/2023   Social anxiety disorder 10/28/2023   Recurrent major depressive disorder, in partial remission (HCC) 10/28/2023   Hx of bipolar disorder 10/28/2023   Long term current use of cannabis 10/28/2023   High risk medication use 10/28/2023   Vitamin D deficiency 08/22/2023   MDD (major depressive disorder), recurrent severe, without psychosis (HCC) 08/10/2023   Major depressive disorder, recurrent episode with mixed features (HCC) 08/03/2023   Anxiety and depression 05/29/2023   Sexual dysfunction 05/29/2023   PTSD (post-traumatic stress disorder) 05/15/2023   Nightmares 05/15/2023   Reactive depression 05/15/2023   Acute appendicitis with localized peritonitis 11/19/2022   MDD (major depressive disorder), single episode, severe ,  no psychosis (HCC) 12/19/2020   Overdose 12/19/2020   Suicide ideation 12/19/2020   Bipolar II disorder (HCC) 12/19/2020    Ted Mcalpine, OT 01/04/2024, 7:48 PM  Kerrin Champagne, OT   [facility] PROGRAM [address] [place], [place], [address] Phone: [phone]   Fax:  [phone]  Name: Donna Wagner MRN: [id number] Date of Birth: 21-Dec-2005

## 2024-01-04 NOTE — Therapy (Signed)
 [facility] PARTIAL HOSPITALIZATION PROGRAM [address] [place], [place], [address] Phone: [phone]   Fax:  [phone]  Occupational Therapy Treatment Virtual Visit via Video Note  I connected with Donna Wagner on 01/04/24 at  8:00 AM EDT by a video enabled telemedicine application and verified that I am speaking with the correct person using two identifiers.  Location: Patient: home Provider: office   I discussed the limitations of evaluation and management by telemedicine and the availability of in person appointments. The patient expressed understanding and agreed to proceed.    The patient was advised to call back or seek an in-person evaluation if the symptoms worsen or if the condition fails to improve as anticipated.  I provided 55 minutes of non-face-to-face time during this encounter.   Patient Details  Name: Donna Wagner MRN: [id number] Date of Birth: 2005/10/25 No data recorded  Encounter Date: 12/30/2023   OT End of Session - 01/04/24 1954     Visit Number 8    Number of Visits 20    Date for OT Re-Evaluation 01/18/24    OT Start Time 1200    OT Stop Time 1255    OT Time Calculation (min) 55 min             Past Medical History:  Diagnosis Date   Anxiety    Bipolar disorder (HCC)    Depression     Past Surgical History:  Procedure Laterality Date   APPENDECTOMY     XI ROBOTIC LAPAROSCOPIC ASSISTED APPENDECTOMY N/A 11/19/2022   Procedure: XI ROBOTIC LAPAROSCOPIC ASSISTED APPENDECTOMY;  Surgeon: Carolan Shiver, MD;  Location: [facility] ORS;  Service: General;  Laterality: N/A;    There were no vitals filed for this visit.   Subjective Assessment - 01/04/24 1953     Currently in Pain? No/denies    Pain Score 0-No pain               Group Session:  S: Doing better today  O: The primary objective of this topic is to explore and understand the concept of occupational balance in the context of  daily living. The term "occupational balance" is defined broadly, encompassing all activities that occupy an individual's time and energy, including self-care, leisure, and work-related tasks. The goal is to guide participants towards achieving a harmonious blend of these activities, tailored to their personal values and life circumstances. This balance is aimed at enhancing overall well-being, not by equally distributing time across activities, but by ensuring that daily engagements are fulfilling and not draining. The content delves into identifying various barriers that individuals face in achieving occupational balance, such as overcommitment, misaligned priorities, external pressures, and lack of effective time management. The impact of these barriers on occupational performance, roles, and lifestyles is examined, highlighting issues like reduced efficiency, strained relationships, and potential health problems. Strategies for cultivating occupational balance are a key focus. These strategies include practical methods like time blocking, prioritizing tasks, establishing self-care rituals, decluttering, connecting with nature, and engaging in reflective practices. These approaches are designed to be adaptable and applicable to a wide range of life scenarios, promoting a proactive and mindful approach to daily living. The overall aim is to equip participants with the knowledge and tools to create a balanced lifestyle that supports their mental, emotional, and physical health, thereby improving their functional performance in daily life.   A:  The patient demonstrated a high level of engagement and active participation throughout the session  on occupational balance. The patient frequently contributed to discussions, offering insightful reflections on personal experiences related to the barriers and strategies for achieving occupational balance. There was a clear understanding of the concept and an ability  to relate it to their own life. The patient showed enthusiasm in learning and applying the strategies discussed, such as time blocking and self-care rituals, indicating a strong motivation to improve their occupational balance. The patient's proactive approach and responsiveness to the topic suggest a high potential for implementing these strategies effectively in their daily routine.   P: Continue to attend PHP OT group sessions 5x week for 4 weeks to promote daily structure, social engagement, and opportunities to develop and utilize adaptive strategies to maximize functional performance in preparation for safe transition and integration back into school, work, and the community. Plan to address topic of pt 3 in next OT group session.                    OT Education - 01/04/24 1954     Education Details Occupational Balance              OT Short Term Goals - 12/19/23 0955       OT SHORT TERM GOAL #1   Title Patient will be educated on strategies to improve psychosocial skills needed to participate fully in all daily, work, and leisure activities.    Time 4    Period Weeks    Status On-going    Target Date 01/18/24      OT SHORT TERM GOAL #2   Title Pt will apply psychosocial skills and coping mechanisms to daily activities in order to function independently and reintegrate into community dwelling    Status On-going      OT SHORT TERM GOAL #3   Title Pt will choose and/or engage in 1-3 socially engaging leisure activities to improve social participation skills upon reintegrating into community    Status On-going      OT SHORT TERM GOAL #4   Title Patient will be educated on strategies to improve psychosocial skills needed to participate fully in all daily, work, and leisure activities.    Status On-going                      Plan - 01/04/24 1954     Psychosocial Skills Coping Strategies;Habits             Patient will benefit from skilled  therapeutic intervention in order to improve the following deficits and impairments:       Psychosocial Skills: Coping Strategies, Habits   Visit Diagnosis: Difficulty coping    Problem List Patient Active Problem List   Diagnosis Date Noted   Borderline personality disorder (HCC) 12/31/2023   Generalized anxiety disorder 12/15/2023   Social anxiety disorder 10/28/2023   Recurrent major depressive disorder, in partial remission (HCC) 10/28/2023   Hx of bipolar disorder 10/28/2023   Long term current use of cannabis 10/28/2023   High risk medication use 10/28/2023   Vitamin D deficiency 08/22/2023   MDD (major depressive disorder), recurrent severe, without psychosis (HCC) 08/10/2023   Major depressive disorder, recurrent episode with mixed features (HCC) 08/03/2023   Anxiety and depression 05/29/2023   Sexual dysfunction 05/29/2023   PTSD (post-traumatic stress disorder) 05/15/2023   Nightmares 05/15/2023   Reactive depression 05/15/2023   Acute appendicitis with localized peritonitis 11/19/2022   MDD (major depressive disorder), single episode, severe , no psychosis (HCC)  12/19/2020   Overdose 12/19/2020   Suicide ideation 12/19/2020   Bipolar II disorder (HCC) 12/19/2020    Ted Mcalpine, OT 01/04/2024, 7:54 PM  Kerrin Champagne, OT   [facility] PROGRAM [address] [place], [place], [address] Phone: [phone]   Fax:  [phone]  Name: Donna Wagner MRN: [id number] Date of Birth: [birth year]

## 2024-01-04 NOTE — Therapy (Signed)
 [facility] PARTIAL HOSPITALIZATION PROGRAM [address] [place], [place], [address] Phone: [phone]   Fax:  [phone]  Occupational Therapy Treatment Virtual Visit via Video Note  I connected with Donna Wagner on 01/04/24 at  8:00 AM EDT by a video enabled telemedicine application and verified that I am speaking with the correct person using two identifiers.  Location: Patient: home Provider: office   I discussed the limitations of evaluation and management by telemedicine and the availability of in person appointments. The patient expressed understanding and agreed to proceed.    The patient was advised to call back or seek an in-person evaluation if the symptoms worsen or if the condition fails to improve as anticipated.  I provided 55 minutes of non-face-to-face time during this encounter.   Patient Details  Name: Donna Wagner MRN: [id number] Date of Birth: 2006/04/08 No data recorded  Encounter Date: 12/29/2023   OT End of Session - 01/04/24 1842     Visit Number 6    Number of Visits 20    Date for OT Re-Evaluation 01/18/24    OT Start Time 1200    OT Stop Time 1255    OT Time Calculation (min) 55 min             Past Medical History:  Diagnosis Date   Anxiety    Bipolar disorder (HCC)    Depression     Past Surgical History:  Procedure Laterality Date   APPENDECTOMY     XI ROBOTIC LAPAROSCOPIC ASSISTED APPENDECTOMY N/A 11/19/2022   Procedure: XI ROBOTIC LAPAROSCOPIC ASSISTED APPENDECTOMY;  Surgeon: Carolan Shiver, MD;  Location: [facility] ORS;  Service: General;  Laterality: N/A;    There were no vitals filed for this visit.   Subjective Assessment - 01/04/24 1841     Currently in Pain? No/denies    Pain Score 0-No pain                  Group Session:  S: Doing better today  O: The primary objective of this topic is to explore and understand the concept of occupational balance in the  context of daily living. The term "occupational balance" is defined broadly, encompassing all activities that occupy an individual's time and energy, including self-care, leisure, and work-related tasks. The goal is to guide participants towards achieving a harmonious blend of these activities, tailored to their personal values and life circumstances. This balance is aimed at enhancing overall well-being, not by equally distributing time across activities, but by ensuring that daily engagements are fulfilling and not draining. The content delves into identifying various barriers that individuals face in achieving occupational balance, such as overcommitment, misaligned priorities, external pressures, and lack of effective time management. The impact of these barriers on occupational performance, roles, and lifestyles is examined, highlighting issues like reduced efficiency, strained relationships, and potential health problems. Strategies for cultivating occupational balance are a key focus. These strategies include practical methods like time blocking, prioritizing tasks, establishing self-care rituals, decluttering, connecting with nature, and engaging in reflective practices. These approaches are designed to be adaptable and applicable to a wide range of life scenarios, promoting a proactive and mindful approach to daily living. The overall aim is to equip participants with the knowledge and tools to create a balanced lifestyle that supports their mental, emotional, and physical health, thereby improving their functional performance in daily life.   A:  The patient demonstrated a high level of engagement and active participation  throughout the session on occupational balance. The patient frequently contributed to discussions, offering insightful reflections on personal experiences related to the barriers and strategies for achieving occupational balance. There was a clear understanding of the concept and  an ability to relate it to their own life. The patient showed enthusiasm in learning and applying the strategies discussed, such as time blocking and self-care rituals, indicating a strong motivation to improve their occupational balance. The patient's proactive approach and responsiveness to the topic suggest a high potential for implementing these strategies effectively in their daily routine.    P: Continue to attend PHP OT group sessions 5x week for 4 weeks to promote daily structure, social engagement, and opportunities to develop and utilize adaptive strategies to maximize functional performance in preparation for safe transition and integration back into school, work, and the community. Plan to address topic of pt 2 in next OT group session.                 OT Education - 01/04/24 1841     Education Details Occupational Balance 1              OT Short Term Goals - 12/19/23 0955       OT SHORT TERM GOAL #1   Title Patient will be educated on strategies to improve psychosocial skills needed to participate fully in all daily, work, and leisure activities.    Time 4    Period Weeks    Status On-going    Target Date 01/18/24      OT SHORT TERM GOAL #2   Title Pt will apply psychosocial skills and coping mechanisms to daily activities in order to function independently and reintegrate into community dwelling    Status On-going      OT SHORT TERM GOAL #3   Title Pt will choose and/or engage in 1-3 socially engaging leisure activities to improve social participation skills upon reintegrating into community    Status On-going      OT SHORT TERM GOAL #4   Title Patient will be educated on strategies to improve psychosocial skills needed to participate fully in all daily, work, and leisure activities.    Status On-going                      Plan - 01/04/24 1842     Psychosocial Skills Coping Strategies;Habits             Patient will benefit  from skilled therapeutic intervention in order to improve the following deficits and impairments:       Psychosocial Skills: Coping Strategies, Habits   Visit Diagnosis: Difficulty coping    Problem List Patient Active Problem List   Diagnosis Date Noted   Borderline personality disorder (HCC) 12/31/2023   Generalized anxiety disorder 12/15/2023   Social anxiety disorder 10/28/2023   Recurrent major depressive disorder, in partial remission (HCC) 10/28/2023   Hx of bipolar disorder 10/28/2023   Long term current use of cannabis 10/28/2023   High risk medication use 10/28/2023   Vitamin D deficiency 08/22/2023   MDD (major depressive disorder), recurrent severe, without psychosis (HCC) 08/10/2023   Major depressive disorder, recurrent episode with mixed features (HCC) 08/03/2023   Anxiety and depression 05/29/2023   Sexual dysfunction 05/29/2023   PTSD (post-traumatic stress disorder) 05/15/2023   Nightmares 05/15/2023   Reactive depression 05/15/2023   Acute appendicitis with localized peritonitis 11/19/2022   MDD (major depressive disorder), single episode, severe , no  psychosis (HCC) 12/19/2020   Overdose 12/19/2020   Suicide ideation 12/19/2020   Bipolar II disorder (HCC) 12/19/2020    Ted Mcalpine, OT 01/04/2024, 6:42 PM  Kerrin Champagne, OT   [facility] PROGRAM [address] [place], [place], [address] Phone: [phone]   Fax:  [phone]  Name: Donna Wagner MRN: [id number] Date of Birth: Apr 04, 2006

## 2024-01-04 NOTE — Therapy (Signed)
 [facility] PARTIAL HOSPITALIZATION PROGRAM [address] [place], [place], [address] Phone: [phone]   Fax:  [phone]  Occupational Therapy Treatment Virtual Visit via Video Note  I connected with Kaitlynd A Bauserman on 01/04/24 at  8:00 AM EDT by a video enabled telemedicine application and verified that I am speaking with the correct person using two identifiers.  Location: Patient: home Provider: office   I discussed the limitations of evaluation and management by telemedicine and the availability of in person appointments. The patient expressed understanding and agreed to proceed.    The patient was advised to call back or seek an in-person evaluation if the symptoms worsen or if the condition fails to improve as anticipated.  I provided 55 minutes of non-face-to-face time during this encounter.   Patient Details  Name: Donna Wagner MRN: [id number] Date of Birth: [birth year] No data recorded  Encounter Date: 12/31/2023   OT End of Session - 01/04/24 2003     Visit Number 9    Number of Visits 20    Date for OT Re-Evaluation 01/18/24    OT Start Time 1200    OT Stop Time 1255    OT Time Calculation (min) 55 min             Past Medical History:  Diagnosis Date   Anxiety    Bipolar disorder (HCC)    Depression     Past Surgical History:  Procedure Laterality Date   APPENDECTOMY     XI ROBOTIC LAPAROSCOPIC ASSISTED APPENDECTOMY N/A 11/19/2022   Procedure: XI ROBOTIC LAPAROSCOPIC ASSISTED APPENDECTOMY;  Surgeon: Carolan Shiver, MD;  Location: [facility] ORS;  Service: General;  Laterality: N/A;    There were no vitals filed for this visit.   Subjective Assessment - 01/04/24 2003     Currently in Pain? No/denies    Pain Score 0-No pain              Group Session:  S: Doing better today.  O: The primary objective of this topic is to explore and understand the concept of occupational balance in the context of  daily living. The term "occupational balance" is defined broadly, encompassing all activities that occupy an individual's time and energy, including self-care, leisure, and work-related tasks. The goal is to guide participants towards achieving a harmonious blend of these activities, tailored to their personal values and life circumstances. This balance is aimed at enhancing overall well-being, not by equally distributing time across activities, but by ensuring that daily engagements are fulfilling and not draining. The content delves into identifying various barriers that individuals face in achieving occupational balance, such as overcommitment, misaligned priorities, external pressures, and lack of effective time management. The impact of these barriers on occupational performance, roles, and lifestyles is examined, highlighting issues like reduced efficiency, strained relationships, and potential health problems. Strategies for cultivating occupational balance are a key focus. These strategies include practical methods like time blocking, prioritizing tasks, establishing self-care rituals, decluttering, connecting with nature, and engaging in reflective practices. These approaches are designed to be adaptable and applicable to a wide range of life scenarios, promoting a proactive and mindful approach to daily living. The overall aim is to equip participants with the knowledge and tools to create a balanced lifestyle that supports their mental, emotional, and physical health, thereby improving their functional performance in daily life.   A:  The patient demonstrated a high level of engagement and active participation throughout the session on  occupational balance. The patient frequently contributed to discussions, offering insightful reflections on personal experiences related to the barriers and strategies for achieving occupational balance. There was a clear understanding of the concept and an ability  to relate it to their own life. The patient showed enthusiasm in learning and applying the strategies discussed, such as time blocking and self-care rituals, indicating a strong motivation to improve their occupational balance. The patient's proactive approach and responsiveness to the topic suggest a high potential for implementing these strategies effectively in their daily routine.   P: Continue to attend PHP OT group sessions 5x week for 4 weeks to promote daily structure, social engagement, and opportunities to develop and utilize adaptive strategies to maximize functional performance in preparation for safe transition and integration back into school, work, and the community. Plan to address topic of pt 4 in next OT group session.                     OT Education - 01/04/24 2003     Education Details Occupational Balance              OT Short Term Goals - 12/19/23 0955       OT SHORT TERM GOAL #1   Title Patient will be educated on strategies to improve psychosocial skills needed to participate fully in all daily, work, and leisure activities.    Time 4    Period Weeks    Status On-going    Target Date 01/18/24      OT SHORT TERM GOAL #2   Title Pt will apply psychosocial skills and coping mechanisms to daily activities in order to function independently and reintegrate into community dwelling    Status On-going      OT SHORT TERM GOAL #3   Title Pt will choose and/or engage in 1-3 socially engaging leisure activities to improve social participation skills upon reintegrating into community    Status On-going      OT SHORT TERM GOAL #4   Title Patient will be educated on strategies to improve psychosocial skills needed to participate fully in all daily, work, and leisure activities.    Status On-going                      Plan - 01/04/24 2003     Psychosocial Skills Coping Strategies;Habits             Patient will benefit from  skilled therapeutic intervention in order to improve the following deficits and impairments:       Psychosocial Skills: Coping Strategies, Habits   Visit Diagnosis: Difficulty coping    Problem List Patient Active Problem List   Diagnosis Date Noted   Borderline personality disorder (HCC) 12/31/2023   Generalized anxiety disorder 12/15/2023   Social anxiety disorder 10/28/2023   Recurrent major depressive disorder, in partial remission (HCC) 10/28/2023   Hx of bipolar disorder 10/28/2023   Long term current use of cannabis 10/28/2023   High risk medication use 10/28/2023   Vitamin D deficiency 08/22/2023   MDD (major depressive disorder), recurrent severe, without psychosis (HCC) 08/10/2023   Major depressive disorder, recurrent episode with mixed features (HCC) 08/03/2023   Anxiety and depression 05/29/2023   Sexual dysfunction 05/29/2023   PTSD (post-traumatic stress disorder) 05/15/2023   Nightmares 05/15/2023   Reactive depression 05/15/2023   Acute appendicitis with localized peritonitis 11/19/2022   MDD (major depressive disorder), single episode, severe , no psychosis (HCC)  12/19/2020   Overdose 12/19/2020   Suicide ideation 12/19/2020   Bipolar II disorder (HCC) 12/19/2020    Ted Mcalpine, OT 01/04/2024, 8:03 PM  Kerrin Champagne, OT   [facility] PROGRAM [address] [place], [place], [address] Phone: [phone]   Fax:  [phone]  Name: LENNON RICHINS MRN: [id number] Date of Birth: 10/17/2006

## 2024-01-05 ENCOUNTER — Other Ambulatory Visit (HOSPITAL_COMMUNITY): Payer: Self-pay | Admitting: Licensed Clinical Social Worker

## 2024-01-05 ENCOUNTER — Other Ambulatory Visit (HOSPITAL_COMMUNITY): Payer: Self-pay

## 2024-01-05 DIAGNOSIS — F332 Major depressive disorder, recurrent severe without psychotic features: Secondary | ICD-10-CM | POA: Diagnosis not present

## 2024-01-05 DIAGNOSIS — R4589 Other symptoms and signs involving emotional state: Secondary | ICD-10-CM

## 2024-01-05 DIAGNOSIS — F603 Borderline personality disorder: Secondary | ICD-10-CM

## 2024-01-05 NOTE — Psych (Signed)
 Virtual Visit via Video Note  I connected with Donna Wagner on 01/05/24 at  9:00 AM EDT by a video enabled telemedicine application and verified that I am speaking with the correct person using two identifiers.  Location: Patient: pt's home in [place], [place] Provider: clinical home office in [place], [place]   I discussed the limitations of evaluation and management by telemedicine and the availability of in person appointments. The patient expressed understanding and agreed to proceed.   I discussed the assessment and treatment plan with the patient. The patient was provided an opportunity to ask questions and all were answered. The patient agreed with the plan and demonstrated an understanding of the instructions.   The patient was advised to call back or seek an in-person evaluation if the symptoms worsen or if the condition fails to improve as anticipated.  I provided 240 minutes of non-face-to-face time during this encounter.   [name], LCSW   [facility] PHP THERAPIST PROGRESS NOTE  Donna Wagner [id number]   Session Time: 9:00 am - 10:00 am  Participation Level: Active  Behavioral Response: CasualAlertmildly depressed  Type of Therapy: Group Therapy  Treatment Goals addressed: Coping  Progress Towards Goals: Progressing  Interventions: CBT, DBT, Solution Focused, Strength-based, Supportive, and Reframing  Therapist Response: Clinician led check-in regarding current stressors and situation, and review of patient completed daily inventory. Clinician utilized active listening and empathetic response and validated patient emotions. Clinician facilitated processing group on pertinent issues.?   Summary: Patient arrived within time allowed. Patient rates her mood at a 3 on a scale of 1-10 with 10 being best. Pt reported, "I accidentally missed my meds two nights in a row. I just physically feel so bad right now. Mood-wise, I'm okay." She reports she slept a lot,  about 15 hours, without a sleep aid, to cope with headache and lightheadedness. She states her crochet market Saturday was her best one yet and it makes her feel good. She reports her appetite has been okay, yesterday did not feel hungry but was able to make herself eat. She denies SI/SH thoughts. She is agreeable to talking about anger and resentment during processing, as suggested by a peer. Pt able to process.?Pt engaged in discussion.?      Session Time: 10:00 am - 11:00 am  Participation Level: Active  Behavioral Response: CasualAlertMildly Depressed  Type of Therapy: Group Therapy  Treatment Goals addressed: Coping  Progress Towards Goals: Progressing  Interventions: CBT, DBT, Solution Focused, Strength-based, Supportive, and Reframing  Therapist Response: Clinician led group on anger and resentment. [name] utilized the [employer] and illustration to demonstrate that other emotions are often beneath the surface of anger. Clinician utilized CBT and ACT principles to inform discussion.   Summary: Pt engaged in discussion. She shares details of how she still resents someone who was involved with her and her ex. She identifies the underlying emotions as threatened and jealous. She is receptive to feedback.     Session Time: 11:00 am - 12:00 pm  Participation Level: Active  Behavioral Response: CasualAlertMildly Depressed  Type of Therapy: Group Therapy  Treatment Goals addressed: Coping  Progress Towards Goals: Progressing  Interventions: CBT, DBT, Solution Focused, Strength-based, Supportive, and Reframing  Therapist Response: Group viewed Ted Talk entitled "How To Not Take Things Personally" presented by Nicanor Alcon. [name] utilized CBT principles to inform discussion while pts were encouraged to share their response to the video. [name] asked each patient to share a time in  which they took things personally and reframed the situation based on what was shared in the [place]  Talk.  Summary: Pt engaged in discussion. She demonstrated good insight into the subject matter.   Session Time: 12:00 pm - 1:00 pm  Participation Level: Active  Behavioral Response: CasualAlertMildly Depressed  Type of Therapy: Group Therapy  Treatment Goals addressed: Coping  Progress Towards Goals: Progressing  Interventions: CBT, DBT, Solution Focused, Strength-based, Supportive, and Reframing  Therapist Response: 12:00 - 12:50 pm: Group was led by occupational therapist, Kerrin Champagne. 12:50 - 1:00 pm: Clinician led check-out. Clinician assessed for immediate needs, medication compliance and efficacy, and safety concerns?  Summary: 12:00 - 12:50 pm: Pt engaged and participated in discussion. 12:50 - 1:00 pm: At check-out, patient contracts for safety.?Patient demonstrates progress as evidenced by her continued engagement and by being receptive to treatment. Patient denies SI/HI/self-harm thoughts at the end of group and agrees to seek help should those thoughts/feelings occur.?   Suicidal/Homicidal: Nowithout intent/plan  Plan: ?Pt will continue in PHP and medication management while continuing to work on decreasing depression symptoms,?SI, and anxiety symptoms,?and increasing the ability to self manage symptoms.     Collaboration of Care: Medication Management AEB Hillery Jacks, NP and Park Pope, MD  Patient/Guardian was advised Release of Information must be obtained prior to any record release in order to collaborate their care with an outside provider. Patient/Guardian was advised if they have not already done so to contact the registration department to sign all necessary forms in order for [place] to release information regarding their care.   Consent: Patient/Guardian gives verbal consent for treatment and assignment of benefits for services provided during this visit. Patient/Guardian expressed understanding and agreed to proceed.   Diagnosis: MDD (major depressive disorder),  recurrent severe, without psychosis (HCC) [F33.2]    1. MDD (major depressive disorder), recurrent severe, without psychosis (HCC)   2. Borderline personality disorder [facility])       [name], LCSW 01/05/2024

## 2024-01-06 ENCOUNTER — Other Ambulatory Visit (HOSPITAL_COMMUNITY): Payer: Self-pay

## 2024-01-06 DIAGNOSIS — F603 Borderline personality disorder: Secondary | ICD-10-CM

## 2024-01-06 DIAGNOSIS — F332 Major depressive disorder, recurrent severe without psychotic features: Secondary | ICD-10-CM | POA: Diagnosis not present

## 2024-01-06 DIAGNOSIS — R4589 Other symptoms and signs involving emotional state: Secondary | ICD-10-CM

## 2024-01-06 NOTE — Psych (Signed)
 Virtual Visit via Video Note  I connected with Donna Wagner on 01/06/24 at  9:00 AM EDT by a video enabled telemedicine application and verified that I am speaking with the correct person using two identifiers.  Location: Patient: pt's home in [place], [place] Provider: clinical home office in [place], [place]   I discussed the limitations of evaluation and management by telemedicine and the availability of in person appointments. The patient expressed understanding and agreed to proceed.   I discussed the assessment and treatment plan with the patient. The patient was provided an opportunity to ask questions and all were answered. The patient agreed with the plan and demonstrated an understanding of the instructions.   The patient was advised to call back or seek an in-person evaluation if the symptoms worsen or if the condition fails to improve as anticipated.  I provided 240 minutes of non-face-to-face time during this encounter.   [name], LCSW    [facility] BH PHP THERAPIST PROGRESS NOTE  Donna Wagner [id number]   Session Time: 9:00 am - 10:00 am  Participation Level: Active  Behavioral Response: CasualAlertAnxious and Depressed  Type of Therapy: Group Therapy  Treatment Goals addressed: Coping  Progress Towards Goals: Progressing  Interventions: CBT, DBT, Solution Focused, Strength-based, Supportive, and Reframing  Therapist Response: Clinician led check-in regarding current stressors and situation, and review of patient completed daily inventory. Clinician utilized active listening and empathetic response and validated patient emotions. Clinician facilitated processing group on pertinent issues.?   Summary: Patient arrived within time allowed. Patient rates her mood at a 5 on a scale of 1-10 with 10 being best. Pt reported, "I got my medicine back in my system, so I feel normal again. I have a few things I have to do today that's stressing me out." She  reports she slept well last night, approximately 9 hours. She states her appetite remains the same and she denies SI/SH thoughts. She states she would like to discuss self-confidence during processing.. Pt able to process.?Pt engaged in discussion.?      Session Time: 10:00 am - 11:00 am  Participation Level: Active  Behavioral Response: CasualAlertAnxious and Depressed  Type of Therapy: Group Therapy  Treatment Goals addressed: Coping  Progress Towards Goals: Progressing  Interventions: CBT, DBT, Solution Focused, Strength-based, Supportive, and Reframing  Therapist Response: Clinician led group on how negative self-talk can impact self-esteem and how to cope with unwanted and recurring thoughts. Clinician utilized CBT and ACT principles (reframing, thought defusion, and acceptance) to inform discussion.  Summary: Pt engaged in discussion. She states she often has the thoughts of "I can't do it" and other related thoughts when others are depending on her. She states she will utilize distraction techniques to cope with the anxiety. She reports she is often able to get through them with assistance.  She also shares that she spoke to her ex boyfriend about her being on the "back burner" and after their conversation she feels reassured. She is able to identify that it is not that she is unworthy, but that he is going through something. She is receptive to feedback.    Session Time: 11:00 am - 12:00 pm  Participation Level: Active  Behavioral Response: CasualAlertAnxious and Depressed  Type of Therapy: Group Therapy  Treatment Goals addressed: Coping  Progress Towards Goals: Progressing  Interventions: CBT, DBT, Solution Focused, Strength-based, Supportive, and Reframing  Therapist Response: Group viewed Ted Talk entitled "The Power of Vulnerability" by Dewain Penning and then cln  led discussion surrounding vulnerability and acceptance of painful emotions. Clinician utilized CBT  principles to inform discussion.   Summary: Pt engaged in discussion. She demonstrates good insight into the subject matter.   Session Time: 12:00 pm - 1:00 pm  Participation Level: Active  Behavioral Response: CasualAlertAnxious and Depressed  Type of Therapy: Group Therapy  Treatment Goals addressed: Coping  Progress Towards Goals: Progressing  Interventions: CBT, DBT, Solution Focused, Strength-based, Supportive, and Reframing  Therapist Response: 12:00 - 12:50 pm: Group was led by occupational therapist, Kerrin Champagne. 12:50 - 1:00 pm: Clinician led check-out. Clinician assessed for immediate needs, medication compliance and efficacy, and safety concerns?  Summary: 12:00 - 12:50 pm: Pt engaged and participated in discussion. 12:50 - 1:00 pm: At check-out, patient contracts for safety.?Patient demonstrates progress as evidenced by her continued engagement and by being receptive to treatment. Patient denies SI/HI/self-harm thoughts at the end of group and agrees to seek help should those thoughts/feelings occur.?   Suicidal/Homicidal: Nowithout intent/plan  Plan: ?Pt will continue in PHP and medication management while continuing to work on decreasing depression symptoms,?SI, and anxiety symptoms,?and increasing the ability to self manage symptoms.    Collaboration of Care: Medication Management AEB Hillery Jacks, NP and Park Pope, MD  Patient/Guardian was advised Release of Information must be obtained prior to any record release in order to collaborate their care with an outside provider. Patient/Guardian was advised if they have not already done so to contact the registration department to sign all necessary forms in order for [place] to release information regarding their care.   Consent: Patient/Guardian gives verbal consent for treatment and assignment of benefits for services provided during this visit. Patient/Guardian expressed understanding and agreed to proceed.    Diagnosis: MDD (major depressive disorder), recurrent severe, without psychosis (HCC) [F33.2]    1. MDD (major depressive disorder), recurrent severe, without psychosis (HCC)   2. Borderline personality disorder [facility])       [name], LCSW 01/06/2024

## 2024-01-07 ENCOUNTER — Other Ambulatory Visit (HOSPITAL_COMMUNITY): Payer: Self-pay

## 2024-01-07 DIAGNOSIS — F332 Major depressive disorder, recurrent severe without psychotic features: Secondary | ICD-10-CM

## 2024-01-07 DIAGNOSIS — R4589 Other symptoms and signs involving emotional state: Secondary | ICD-10-CM

## 2024-01-07 DIAGNOSIS — F603 Borderline personality disorder: Secondary | ICD-10-CM

## 2024-01-07 NOTE — Progress Notes (Signed)
 Spoke with patient via telephone since microphone was not working on computer. States that groups are going well. No issues or complaints. She has been sleeping a lot. Sleeps 10 hours at night as well as 3-5 hour naps during the day. She has a history of low iron so it was suggested she take her MVI with iron and to monitor sleep. If it continues she will discuss with her MD for possible Neurontin causing drowsiness during the day. Denies SI/HI or AV hallucinations. On scale 1-10 as 10 being worst she rates depression at 3 and anxiety at 3.

## 2024-01-07 NOTE — Psych (Signed)
 Virtual Visit via Video Note  I connected with Donna Wagner on 01/07/24 at  9:00 AM EDT by a video enabled telemedicine application and verified that I am speaking with the correct person using two identifiers.  Location: Patient: pt's friend's home in [place], [place] Provider: clinical home office in [place], [place]   I discussed the limitations of evaluation and management by telemedicine and the availability of in person appointments. The patient expressed understanding and agreed to proceed.   I discussed the assessment and treatment plan with the patient. The patient was provided an opportunity to ask questions and all were answered. The patient agreed with the plan and demonstrated an understanding of the instructions.   The patient was advised to call back or seek an in-person evaluation if the symptoms worsen or if the condition fails to improve as anticipated.  I provided 240 minutes of non-face-to-face time during this encounter.   [name], LCSW   [facility] BH PHP THERAPIST PROGRESS NOTE  Donna Wagner [id number]   Session Time: 9:00 am - 10:00 am  Participation Level: Active  Behavioral Response: CasualAlertDepressed  Type of Therapy: Group Therapy  Treatment Goals addressed: Coping  Progress Towards Goals: Progressing  Interventions: CBT, DBT, Solution Focused, Strength-based, Supportive, and Reframing  Therapist Response: Clinician led check-in regarding current stressors and situation, and review of patient completed daily inventory. Clinician utilized active listening and empathetic response and validated patient emotions. Clinician facilitated processing group on pertinent issues.?   Summary: Patient arrived within time allowed. Patient rates her mood at a 6 on a scale of 1-10 with 10 being best. Pt reported,  "The days are getting progressively better. I haven't been forcing myself to sleep to avoid anything, so that's a good sign." She reports a  normal amount of sleep and states she feels well-rested. She states her appetite remains the same and denies SI/SH thoughts. She denies having a topic for processing. Pt able to process.?Pt engaged in discussion.?      Session Time: 10:00 am - 11:00 am  Participation Level: Active  Behavioral Response: CasualAlertDepressed  Type of Therapy: Group Therapy  Treatment Goals addressed: Coping  Progress Towards Goals: Progressing  Interventions: CBT, DBT, Solution Focused, Strength-based, Supportive, and Reframing  Therapist Response: Clinician led group on rejection and fear of abandonment. Clinician utilized CBT and ACT principles to inform discussion.   Summary: Pt engaged in discussion. Pt states she "psychs herself out" before rejection can take place. She is receptive to feedback.    Session Time: 11:00 am - 12:00 pm  Participation Level: Active  Behavioral Response: CasualAlertDepressed  Type of Therapy: Group Therapy  Treatment Goals addressed: Coping  Progress Towards Goals: Progressing  Interventions: CBT, DBT, Solution Focused, Strength-based, Supportive, and Reframing  Therapist Response: Chaplain, Felipe Drone, led group on strength.  Summary: Pt engaged in discussion.    Session Time: 12:00 pm - 1:00 pm  Participation Level: Active  Behavioral Response: CasualAlertDepressed  Type of Therapy: Group Therapy  Treatment Goals addressed: Coping  Progress Towards Goals: Progressing  Interventions: CBT, DBT, Solution Focused, Strength-based, Supportive, and Reframing  Therapist Response: 12:00 - 12:50 pm: Group was led by occupational therapist, Kerrin Champagne. 12:50 - 1:00 pm: Clinician led check-out. Clinician assessed for immediate needs, medication compliance and efficacy, and safety concerns?  Summary: 12:00 - 12:50 pm: Pt engaged and participated in discussion. 12:50 - 1:00 pm: At check-out, patient contracts for safety.?Patient demonstrates  progress as evidenced by her continued engagement  and by being receptive to treatment. Patient denies SI/HI/self-harm thoughts at the end of group and agrees to seek help should those thoughts/feelings occur.?   Suicidal/Homicidal: Nowithout intent/plan  Plan: ?Pt will continue in PHP and medication management while continuing to work on decreasing depression symptoms,?SI, and anxiety symptoms,?and increasing the ability to self manage symptoms.     Collaboration of Care: Other provider involved in patient's care AEB OT [facility]  Patient/Guardian was advised Release of Information must be obtained prior to any record release in order to collaborate their care with an outside provider. Patient/Guardian was advised if they have not already done so to contact the registration department to sign all necessary forms in order for [place] to release information regarding their care.   Consent: Patient/Guardian gives verbal consent for treatment and assignment of benefits for services provided during this visit. Patient/Guardian expressed understanding and agreed to proceed.   Diagnosis: MDD (major depressive disorder), recurrent severe, without psychosis (HCC) [F33.2]    1. MDD (major depressive disorder), recurrent severe, without psychosis (HCC)   2. Borderline personality disorder [facility])       [name], LCSW 01/07/2024
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
## 2024-01-08 ENCOUNTER — Other Ambulatory Visit (HOSPITAL_COMMUNITY): Payer: Self-pay

## 2024-01-08 NOTE — Psych (Signed)
 Virtual Visit via Video Note  I connected with Dorcus A Naeem on 01/02/24 at  9:00 AM EDT by a video enabled telemedicine application and verified that I am speaking with the correct person using two identifiers.  Location: Patient: Patient's Home Provider: clinical home office   I discussed the limitations of evaluation and management by telemedicine and the availability of in person appointments. The patient expressed understanding and agreed to proceed. I discussed the assessment and treatment plan with the patient. The patient was provided an opportunity to ask questions and all were answered. The patient agreed with the plan and demonstrated an understanding of the instructions.   The patient was advised to call back or seek an in-person evaluation if the symptoms worsen or if the condition fails to improve as anticipated.  I provided 240 minutes of non-face-to-face time during this encounter.   Quinn Axe, [facility]    [facility] PHP THERAPIST PROGRESS NOTE  BRITNI DRISCOLL [id number]   Session Time: 9:00 am - 10:00 am  Participation Level: Active  Behavioral Response: CasualAlertDepressed  Type of Therapy: Group Therapy  Treatment Goals addressed: Coping  Progress Towards Goals: Progressing  Interventions: CBT, DBT, Solution Focused, Strength-based, Supportive, and Reframing  Therapist Response: Clinician led check-in regarding current stressors and situation, and review of patient completed daily inventory. Clinician utilized active listening and empathetic response and validated patient emotions. Clinician facilitated processing group on pertinent issues.?   Summary: Patient arrived within time allowed. Patient rates her depression and anxiety at a 2 on a scale of 1-10, 10 being the highest level of depression/anxiety. Pt reported she's doing "pretty well." She shares that today is her birthday and she is looking forward to celebrating after group. She shares she  continues to have vivid dreams but not nightmares at this time. She denies SI/SH thoughts. Pt able to process.?Pt engaged in discussion.?      Session Time: 10:00 am - 11:00 am  Participation Level: Active  Behavioral Response: CasualAlertDepressed  Type of Therapy: Group Therapy  Treatment Goals addressed: Coping  Progress Towards Goals: Progressing  Interventions: CBT, DBT, Solution Focused, Strength-based, Supportive, and Reframing   Therapist Response: Clinician led group on trust, balance being important in life, and stuffing problems creating a "volcano" which will explode at some point.     Summary: Pt engaged in discussion.    Session Time: 11:00 am - 12:00 pm  Participation Level: Active  Behavioral Response: CasualAlertDepressed  Type of Therapy: Group Therapy  Treatment Goals addressed: Coping  Progress Towards Goals: Progressing  Interventions: CBT, DBT, Solution Focused, Strength-based, Supportive, and Reframing  Summary: [name] continued topic of positive psychology. Group discussed 5 strategies to help change lens. Patients identified one strategy they would be willing to try to change their "lens" for at least 21 days to create a new habit.    Therapist Response:  Pt engaged in discussion. Pt will participate in at least 2 minutes of exercise each day to try to change her lens.   Session Time: 12:00 pm - 1:00 pm  Participation Level: Active  Behavioral Response: CasualAlertDepressed  Type of Therapy: Group Therapy  Treatment Goals addressed: Coping  Progress Towards Goals: Progressing  Interventions: CBT, DBT, Solution Focused, Strength-based, Supportive, and Reframing  Therapist Response: 12:00 - 12:50 pm: Group was led by occupational therapist, Kerrin Champagne. 12:50 - 1:00 pm: Clinician led check-out. Clinician assessed for immediate needs, medication compliance and efficacy, and safety concerns?  Summary: 12:00 - 12:50 pm:  Pt engaged and  participated in discussion. 12:50 - 1:00 pm: At check-out, patient contracts for safety.?Patient demonstrates progress as evidenced by her continued engagement and by being receptive to treatment. Patient denies SI/HI/self-harm thoughts at the end of group and agrees to seek help should those thoughts/feelings occur.?   Suicidal/Homicidal: Nowithout intent/plan  Plan: ?Pt will continue in PHP and medication management while continuing to work on decreasing depression symptoms,?SI, and anxiety symptoms,?and increasing the ability to self manage symptoms.    Collaboration of Care: Other provider involved in patient's care AEB OT [facility]  Patient/Guardian was advised Release of Information must be obtained prior to any record release in order to collaborate their care with an outside provider. Patient/Guardian was advised if they have not already done so to contact the registration department to sign all necessary forms in order for [place] to release information regarding their care.   Consent: Patient/Guardian gives verbal consent for treatment and assignment of benefits for services provided during this visit. Patient/Guardian expressed understanding and agreed to proceed.   Diagnosis: MDD (major depressive disorder), recurrent severe, without psychosis (HCC) [F33.2]    1. MDD (major depressive disorder), recurrent severe, without psychosis (HCC)   2. Borderline personality disorder [facility])      Quinn Axe, [facility] 01/02/2024

## 2024-01-09 ENCOUNTER — Other Ambulatory Visit (HOSPITAL_COMMUNITY): Payer: Self-pay

## 2024-01-09 ENCOUNTER — Telehealth (HOSPITAL_COMMUNITY): Payer: Self-pay | Admitting: Professional

## 2024-01-09 ENCOUNTER — Encounter (HOSPITAL_COMMUNITY): Payer: Self-pay

## 2024-01-09 NOTE — Therapy (Signed)
 [facility] PARTIAL HOSPITALIZATION PROGRAM [address] [place], [place], [address] Phone: [phone]   Fax:  [phone]  Occupational Therapy Treatment Virtual Visit via Video Note  I connected with Donna Wagner on 01/09/24 at  8:00 AM EDT by a video enabled telemedicine application and verified that I am speaking with the correct person using two identifiers.  Location: Patient: home Provider: office   I discussed the limitations of evaluation and management by telemedicine and the availability of in person appointments. The patient expressed understanding and agreed to proceed.    The patient was advised to call back or seek an in-person evaluation if the symptoms worsen or if the condition fails to improve as anticipated.  I provided 55 minutes of non-face-to-face time during this encounter.   Patient Details  Name: Donna Wagner MRN: [id number] Date of Birth: 03/05/2006 No data recorded  Encounter Date: 01/01/2024   OT End of Session - 01/09/24 1134     Visit Number 10    Number of Visits 20    Date for OT Re-Evaluation 01/18/24    OT Start Time 1200    OT Stop Time 1255    OT Time Calculation (min) 55 min             Past Medical History:  Diagnosis Date   Anxiety    Bipolar disorder (HCC)    Depression     Past Surgical History:  Procedure Laterality Date   APPENDECTOMY     XI ROBOTIC LAPAROSCOPIC ASSISTED APPENDECTOMY N/A 11/19/2022   Procedure: XI ROBOTIC LAPAROSCOPIC ASSISTED APPENDECTOMY;  Surgeon: Carolan Shiver, MD;  Location: [facility] ORS;  Service: General;  Laterality: N/A;    There were no vitals filed for this visit.   Subjective Assessment - 01/09/24 1134     Currently in Pain? No/denies    Pain Score 0-No pain               Group Session:  S: Doing better today  O: The primary objective of this topic is to explore and understand the concept of occupational balance in the context of  daily living. The term "occupational balance" is defined broadly, encompassing all activities that occupy an individual's time and energy, including self-care, leisure, and work-related tasks. The goal is to guide participants towards achieving a harmonious blend of these activities, tailored to their personal values and life circumstances. This balance is aimed at enhancing overall well-being, not by equally distributing time across activities, but by ensuring that daily engagements are fulfilling and not draining. The content delves into identifying various barriers that individuals face in achieving occupational balance, such as overcommitment, misaligned priorities, external pressures, and lack of effective time management. The impact of these barriers on occupational performance, roles, and lifestyles is examined, highlighting issues like reduced efficiency, strained relationships, and potential health problems. Strategies for cultivating occupational balance are a key focus. These strategies include practical methods like time blocking, prioritizing tasks, establishing self-care rituals, decluttering, connecting with nature, and engaging in reflective practices. These approaches are designed to be adaptable and applicable to a wide range of life scenarios, promoting a proactive and mindful approach to daily living. The overall aim is to equip participants with the knowledge and tools to create a balanced lifestyle that supports their mental, emotional, and physical health, thereby improving their functional performance in daily life.   A:  The patient demonstrated a high level of engagement and active participation throughout the session  on occupational balance. The patient frequently contributed to discussions, offering insightful reflections on personal experiences related to the barriers and strategies for achieving occupational balance. There was a clear understanding of the concept and an ability  to relate it to their own life. The patient showed enthusiasm in learning and applying the strategies discussed, such as time blocking and self-care rituals, indicating a strong motivation to improve their occupational balance. The patient's proactive approach and responsiveness to the topic suggest a high potential for implementing these strategies effectively in their daily routine.   P: Continue to attend PHP OT group sessions 5x week for 4 weeks to promote daily structure, social engagement, and opportunities to develop and utilize adaptive strategies to maximize functional performance in preparation for safe transition and integration back into school, work, and the community. Plan to address topic of tbd in next OT group session.                    OT Education - 01/09/24 1134     Education Details Occupational Balance              OT Short Term Goals - 12/19/23 0955       OT SHORT TERM GOAL #1   Title Patient will be educated on strategies to improve psychosocial skills needed to participate fully in all daily, work, and leisure activities.    Time 4    Period Weeks    Status On-going    Target Date 01/18/24      OT SHORT TERM GOAL #2   Title Pt will apply psychosocial skills and coping mechanisms to daily activities in order to function independently and reintegrate into community dwelling    Status On-going      OT SHORT TERM GOAL #3   Title Pt will choose and/or engage in 1-3 socially engaging leisure activities to improve social participation skills upon reintegrating into community    Status On-going      OT SHORT TERM GOAL #4   Title Patient will be educated on strategies to improve psychosocial skills needed to participate fully in all daily, work, and leisure activities.    Status On-going                      Plan - 01/09/24 1134     Psychosocial Skills Coping Strategies;Habits             Patient will benefit from skilled  therapeutic intervention in order to improve the following deficits and impairments:       Psychosocial Skills: Coping Strategies, Habits   Visit Diagnosis: Difficulty coping    Problem List Patient Active Problem List   Diagnosis Date Noted   Borderline personality disorder (HCC) 12/31/2023   Generalized anxiety disorder 12/15/2023   Social anxiety disorder 10/28/2023   Recurrent major depressive disorder, in partial remission (HCC) 10/28/2023   Hx of bipolar disorder 10/28/2023   Long term current use of cannabis 10/28/2023   High risk medication use 10/28/2023   Vitamin D deficiency 08/22/2023   MDD (major depressive disorder), recurrent severe, without psychosis (HCC) 08/10/2023   Major depressive disorder, recurrent episode with mixed features (HCC) 08/03/2023   Anxiety and depression 05/29/2023   Sexual dysfunction 05/29/2023   PTSD (post-traumatic stress disorder) 05/15/2023   Nightmares 05/15/2023   Reactive depression 05/15/2023   Acute appendicitis with localized peritonitis 11/19/2022   MDD (major depressive disorder), single episode, severe , no psychosis (HCC) 12/19/2020  Overdose 12/19/2020   Suicide ideation 12/19/2020   Bipolar II disorder (HCC) 12/19/2020    Ted Mcalpine, OT 01/09/2024, 11:34 AM   Kerrin Champagne, OT   [facility] PROGRAM [address] [place], [place], [address] Phone: [phone]   Fax:  [phone]  Name: Donna Wagner MRN: [id number] Date of Birth: [birth year]

## 2024-01-12 ENCOUNTER — Other Ambulatory Visit (HOSPITAL_COMMUNITY): Payer: Self-pay

## 2024-01-12 ENCOUNTER — Other Ambulatory Visit (HOSPITAL_COMMUNITY): Payer: Self-pay | Admitting: Licensed Clinical Social Worker

## 2024-01-12 DIAGNOSIS — R4589 Other symptoms and signs involving emotional state: Secondary | ICD-10-CM

## 2024-01-12 DIAGNOSIS — F332 Major depressive disorder, recurrent severe without psychotic features: Secondary | ICD-10-CM | POA: Diagnosis not present

## 2024-01-12 DIAGNOSIS — F603 Borderline personality disorder: Secondary | ICD-10-CM

## 2024-01-13 ENCOUNTER — Other Ambulatory Visit (HOSPITAL_COMMUNITY): Payer: Self-pay

## 2024-01-13 ENCOUNTER — Encounter (HOSPITAL_COMMUNITY): Payer: Self-pay

## 2024-01-13 ENCOUNTER — Other Ambulatory Visit (HOSPITAL_COMMUNITY): Payer: Self-pay | Admitting: Licensed Clinical Social Worker

## 2024-01-13 DIAGNOSIS — F332 Major depressive disorder, recurrent severe without psychotic features: Secondary | ICD-10-CM | POA: Diagnosis not present

## 2024-01-13 DIAGNOSIS — F603 Borderline personality disorder: Secondary | ICD-10-CM

## 2024-01-13 DIAGNOSIS — R4589 Other symptoms and signs involving emotional state: Secondary | ICD-10-CM
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
## 2024-01-13 NOTE — Therapy (Signed)
 [facility] PARTIAL HOSPITALIZATION PROGRAM [address] [place], [place], [address] Phone: [phone]   Fax:  [phone]  Occupational Therapy Treatment Virtual Visit via Video Note  I connected with Donna Wagner on 01/13/24 at  8:00 AM EDT by a video enabled telemedicine application and verified that I am speaking with the correct person using two identifiers.  Location: Patient: home Provider: office   I discussed the limitations of evaluation and management by telemedicine and the availability of in person appointments. The patient expressed understanding and agreed to proceed.    The patient was advised to call back or seek an in-person evaluation if the symptoms worsen or if the condition fails to improve as anticipated.  I provided 55 minutes of non-face-to-face time during this encounter.   Patient Details  Name: Donna Wagner MRN: [id number] Date of Birth: December 27, 2005 No data recorded  Encounter Date: 01/06/2024   OT End of Session - 01/13/24 0919     Visit Number 13    Number of Visits 20    Date for OT Re-Evaluation 01/18/24    OT Start Time 1200    OT Stop Time 1255    OT Time Calculation (min) 55 min             Past Medical History:  Diagnosis Date   Anxiety    Bipolar disorder (HCC)    Depression     Past Surgical History:  Procedure Laterality Date   APPENDECTOMY     XI ROBOTIC LAPAROSCOPIC ASSISTED APPENDECTOMY N/A 11/19/2022   Procedure: XI ROBOTIC LAPAROSCOPIC ASSISTED APPENDECTOMY;  Surgeon: Carolan Shiver, MD;  Location: [facility] ORS;  Service: General;  Laterality: N/A;    There were no vitals filed for this visit.   Subjective Assessment - 01/13/24 0919     Currently in Pain? No/denies    Pain Score 0-No pain                Group Session:  S: Feeling better today.   O: During today's OT group session, the patient participated in an educational segment about the importance of  goal-setting and the application of the SMART framework to enhance daily life, particularly focusing on ADLs and iADLs. The session began with five open-ended pre-session questions that facilitated group discussion and introspection about their current relationship with goals. Following the introduction and educational segment, participants engaged in brainstorming and group discussions to devise hypothetical SMART goals. The session concluded with five post-session questions to reinforce understanding and facilitate reflection. Throughout the session, there was a range of engagement levels noted among the participants.   A:  Patient demonstrated a high level of engagement throughout the session. They actively participated in discussions, sharing personal experiences related to goal setting and challenges faced. Patient was able to clearly articulate an understanding of the SMART framework and proposed personal SMART goals related to their own ADLs with minimal assistance. They expressed enthusiasm about applying what they learned to their daily routine and appeared motivated to make changes.    P: Continue to attend PHP OT group sessions 5x week for 4 weeks to promote daily structure, social engagement, and opportunities to develop and utilize adaptive strategies to maximize functional performance in preparation for safe transition and integration back into school, work, and the community. Plan to address topic of pt 2 in next OT group session.  OT Education - 01/13/24 0919     Education Details SMART Goals              OT Short Term Goals - 12/19/23 0955       OT SHORT TERM GOAL #1   Title Patient will be educated on strategies to improve psychosocial skills needed to participate fully in all daily, work, and leisure activities.    Time 4    Period Weeks    Status On-going    Target Date 01/18/24      OT SHORT TERM GOAL #2   Title Pt will apply  psychosocial skills and coping mechanisms to daily activities in order to function independently and reintegrate into community dwelling    Status On-going      OT SHORT TERM GOAL #3   Title Pt will choose and/or engage in 1-3 socially engaging leisure activities to improve social participation skills upon reintegrating into community    Status On-going      OT SHORT TERM GOAL #4   Title Patient will be educated on strategies to improve psychosocial skills needed to participate fully in all daily, work, and leisure activities.    Status On-going                      Plan - 01/13/24 0920     Psychosocial Skills Coping Strategies;Habits             Patient will benefit from skilled therapeutic intervention in order to improve the following deficits and impairments:       Psychosocial Skills: Coping Strategies, Habits   Visit Diagnosis: Difficulty coping    Problem List Patient Active Problem List   Diagnosis Date Noted   Borderline personality disorder (HCC) 12/31/2023   Generalized anxiety disorder 12/15/2023   Social anxiety disorder 10/28/2023   Recurrent major depressive disorder, in partial remission (HCC) 10/28/2023   Hx of bipolar disorder 10/28/2023   Long term current use of cannabis 10/28/2023   High risk medication use 10/28/2023   Vitamin D deficiency 08/22/2023   MDD (major depressive disorder), recurrent severe, without psychosis (HCC) 08/10/2023   Major depressive disorder, recurrent episode with mixed features (HCC) 08/03/2023   Anxiety and depression 05/29/2023   Sexual dysfunction 05/29/2023   PTSD (post-traumatic stress disorder) 05/15/2023   Nightmares 05/15/2023   Reactive depression 05/15/2023   Acute appendicitis with localized peritonitis 11/19/2022   MDD (major depressive disorder), single episode, severe , no psychosis (HCC) 12/19/2020   Overdose 12/19/2020   Suicide ideation 12/19/2020   Bipolar II disorder (HCC) 12/19/2020     Ted Mcalpine, OT 01/13/2024, 9:20 AM  Kerrin Champagne, OT   [facility] HOSPITALIZATION PROGRAM [address] [place], [place], [address] Phone: [phone]   Fax:  [phone]  Name: Donna Wagner MRN: [id number] Date of Birth: 2006-03-19

## 2024-01-13 NOTE — Therapy (Signed)
 [facility] PARTIAL HOSPITALIZATION PROGRAM [address] [place], [place], [address] Phone: [phone]   Fax:  [phone]  Occupational Therapy Treatment Virtual Visit via Video Note  I connected with Donna Wagner on 01/13/24 at  8:00 AM EDT by a video enabled telemedicine application and verified that I am speaking with the correct person using two identifiers.  Location: Patient: home Provider: office   I discussed the limitations of evaluation and management by telemedicine and the availability of in person appointments. The patient expressed understanding and agreed to proceed.    The patient was advised to call back or seek an in-person evaluation if the symptoms worsen or if the condition fails to improve as anticipated.  I provided 55 minutes of non-face-to-face time during this encounter.   Patient Details  Name: Donna Wagner MRN: [id number] Date of Birth: [birth year] No data recorded  Encounter Date: 01/05/2024   OT End of Session - 01/13/24 0913     Visit Number 12    Number of Visits 20    Date for OT Re-Evaluation 01/18/24    OT Start Time 1200    OT Stop Time 1255    OT Time Calculation (min) 55 min             Past Medical History:  Diagnosis Date   Anxiety    Bipolar disorder (HCC)    Depression     Past Surgical History:  Procedure Laterality Date   APPENDECTOMY     XI ROBOTIC LAPAROSCOPIC ASSISTED APPENDECTOMY N/A 11/19/2022   Procedure: XI ROBOTIC LAPAROSCOPIC ASSISTED APPENDECTOMY;  Surgeon: Carolan Shiver, MD;  Location: [facility] ORS;  Service: General;  Laterality: N/A;    There were no vitals filed for this visit.   Subjective Assessment - 01/13/24 0912     Currently in Pain? No/denies    Pain Score 0-No pain                   Group Session:  S: Feeling better today.   O: Todays OT group therapy focused on structured physical activity to manage depressive symptoms, delivered  using the FITT framework. The session (Frequency: weekly live meeting with encouragement to complete two additional home sessions per week) was conducted at a moderate intensity (self-reported RPE 12-13/20; estimated heart rate ~110?bpm). Time comprised a 5-minute dynamic warm-up, 30 minutes of continuous aerobic movements (marching in place, side steps), 15 minutes of a bodyweight resistance circuit (10 squats, 10 lunges, 10 step-ups, 10 wall push-ups with brief single-leg balance holds per round), and a 5-minute cool-down of static stretching. Type included aerobic, strength, balance, and flexibility exercises adaptable to participants' home environments without equipment. All participants completed the full session with minimal rest breaks, reported an average mood improvement of 2 points on a 10-point scale, verbalized increased energy, and experienced no adverse events. Built-in progression calls for increasing aerobic duration by 5 minutes weekly, raising target RPE to 14-15 by week 4, introducing resistance bands by week 6, and adding interval training and advanced balance drills by month 2.   A:  Patient demonstrated strong active participation throughout the virtual session, meeting the prescribed FITT parameters without difficulty. Self-reported RPE of 12-13 aligned with observed moderate intensity effort, and the patient completed all aerobic, resistance, balance, and flexibility components with minimal rest. The patient's mood rating improved by two points post-session and they verbally expressed feeling "energized" and motivated to continue home practice. These findings indicate positive response to  the current activity prescription and support progression of aerobic duration and resistance complexity as planned.    P: Continue to attend PHP OT group sessions 5x week for 4 weeks to promote daily structure, social engagement, and opportunities to develop and utilize adaptive strategies to maximize  functional performance in preparation for safe transition and integration back into school, work, and the community. Plan to address topic of tbd in next OT group session.                OT Education - 01/13/24 0912     Education Details Physical Activity to Improve Daily Performance 2              OT Short Term Goals - 12/19/23 0955       OT SHORT TERM GOAL #1   Title Patient will be educated on strategies to improve psychosocial skills needed to participate fully in all daily, work, and leisure activities.    Time 4    Period Weeks    Status On-going    Target Date 01/18/24      OT SHORT TERM GOAL #2   Title Pt will apply psychosocial skills and coping mechanisms to daily activities in order to function independently and reintegrate into community dwelling    Status On-going      OT SHORT TERM GOAL #3   Title Pt will choose and/or engage in 1-3 socially engaging leisure activities to improve social participation skills upon reintegrating into community    Status On-going      OT SHORT TERM GOAL #4   Title Patient will be educated on strategies to improve psychosocial skills needed to participate fully in all daily, work, and leisure activities.    Status On-going                      Plan - 01/13/24 0913     Psychosocial Skills Coping Strategies;Habits             Patient will benefit from skilled therapeutic intervention in order to improve the following deficits and impairments:       Psychosocial Skills: Coping Strategies, Habits   Visit Diagnosis: Difficulty coping    Problem List Patient Active Problem List   Diagnosis Date Noted   Borderline personality disorder (HCC) 12/31/2023   Generalized anxiety disorder 12/15/2023   Social anxiety disorder 10/28/2023   Recurrent major depressive disorder, in partial remission (HCC) 10/28/2023   Hx of bipolar disorder 10/28/2023   Long term current use of cannabis 10/28/2023    High risk medication use 10/28/2023   Vitamin D deficiency 08/22/2023   MDD (major depressive disorder), recurrent severe, without psychosis (HCC) 08/10/2023   Major depressive disorder, recurrent episode with mixed features (HCC) 08/03/2023   Anxiety and depression 05/29/2023   Sexual dysfunction 05/29/2023   PTSD (post-traumatic stress disorder) 05/15/2023   Nightmares 05/15/2023   Reactive depression 05/15/2023   Acute appendicitis with localized peritonitis 11/19/2022   MDD (major depressive disorder), single episode, severe , no psychosis (HCC) 12/19/2020   Overdose 12/19/2020   Suicide ideation 12/19/2020   Bipolar II disorder (HCC) 12/19/2020    Ted Mcalpine, OT 01/13/2024, 9:13 AM  Kerrin Champagne, OT   [facility] HOSPITALIZATION PROGRAM [address] [place], [place], [address] Phone: [phone]   Fax:  [phone]  Name: Donna Wagner MRN: [id number] Date of Birth: 24-Feb-2006

## 2024-01-13 NOTE — Therapy (Signed)
 [facility] PARTIAL HOSPITALIZATION PROGRAM [address] [place], [place], [address] Phone: [phone]   Fax:  [phone]  Occupational Therapy Treatment Virtual Visit via Video Note  I connected with Raelea A Basu on 01/13/24 at  8:00 AM EDT by a video enabled telemedicine application and verified that I am speaking with the correct person using two identifiers.  Location: Patient: home Provider: office   I discussed the limitations of evaluation and management by telemedicine and the availability of in person appointments. The patient expressed understanding and agreed to proceed.    The patient was advised to call back or seek an in-person evaluation if the symptoms worsen or if the condition fails to improve as anticipated.  I provided 55 minutes of non-face-to-face time during this encounter.   Patient Details  Name: Donna Wagner MRN: [id number] Date of Birth: [birth year] No data recorded  Encounter Date: 01/02/2024   OT End of Session - 01/13/24 0903     Visit Number 11    Number of Visits 20    Date for OT Re-Evaluation 01/18/24    OT Start Time 1200    OT Stop Time 1255    OT Time Calculation (min) 55 min    Activity Tolerance Patient tolerated treatment well             Past Medical History:  Diagnosis Date   Anxiety    Bipolar disorder (HCC)    Depression     Past Surgical History:  Procedure Laterality Date   APPENDECTOMY     XI ROBOTIC LAPAROSCOPIC ASSISTED APPENDECTOMY N/A 11/19/2022   Procedure: XI ROBOTIC LAPAROSCOPIC ASSISTED APPENDECTOMY;  Surgeon: Carolan Shiver, MD;  Location: [facility] ORS;  Service: General;  Laterality: N/A;    There were no vitals filed for this visit.   Subjective Assessment - 01/13/24 0902     Currently in Pain? No/denies    Pain Score 0-No pain                Group Session:  S: Feeling better today.   O: Todays OT group therapy focused on structured physical  activity to manage depressive symptoms, delivered using the FITT framework. The session (Frequency: weekly live meeting with encouragement to complete two additional home sessions per week) was conducted at a moderate intensity (self-reported RPE 12-13/20; estimated heart rate ~110?bpm). Time comprised a 5-minute dynamic warm-up, 30 minutes of continuous aerobic movements (marching in place, side steps), 15 minutes of a bodyweight resistance circuit (10 squats, 10 lunges, 10 step-ups, 10 wall push-ups with brief single-leg balance holds per round), and a 5-minute cool-down of static stretching. Type included aerobic, strength, balance, and flexibility exercises adaptable to participants' home environments without equipment. All participants completed the full session with minimal rest breaks, reported an average mood improvement of 2 points on a 10-point scale, verbalized increased energy, and experienced no adverse events. Built-in progression calls for increasing aerobic duration by 5 minutes weekly, raising target RPE to 14-15 by week 4, introducing resistance bands by week 6, and adding interval training and advanced balance drills by month 2.   A: Patient demonstrated strong active participation throughout the virtual session, meeting the prescribed FITT parameters without difficulty. Self-reported RPE of 12-13 aligned with observed moderate intensity effort, and the patient completed all aerobic, resistance, balance, and flexibility components with minimal rest. The patient's mood rating improved by two points post-session and they verbally expressed feeling "energized" and motivated to continue home practice. These  findings indicate positive response to the current activity prescription and support progression of aerobic duration and resistance complexity as planned.    P: Continue to attend PHP OT group sessions 5x week for 4 weeks to promote daily structure, social engagement, and opportunities to  develop and utilize adaptive strategies to maximize functional performance in preparation for safe transition and integration back into school, work, and the community. Plan to address topic of tbd in next OT group session.                     OT Education - 01/13/24 0902     Education Details Physical Activity to Improve Daily Performance              OT Short Term Goals - 12/19/23 0955       OT SHORT TERM GOAL #1   Title Patient will be educated on strategies to improve psychosocial skills needed to participate fully in all daily, work, and leisure activities.    Time 4    Period Weeks    Status On-going    Target Date 01/18/24      OT SHORT TERM GOAL #2   Title Pt will apply psychosocial skills and coping mechanisms to daily activities in order to function independently and reintegrate into community dwelling    Status On-going      OT SHORT TERM GOAL #3   Title Pt will choose and/or engage in 1-3 socially engaging leisure activities to improve social participation skills upon reintegrating into community    Status On-going      OT SHORT TERM GOAL #4   Title Patient will be educated on strategies to improve psychosocial skills needed to participate fully in all daily, work, and leisure activities.    Status On-going                      Plan - 01/13/24 0903     Psychosocial Skills Coping Strategies;Habits             Patient will benefit from skilled therapeutic intervention in order to improve the following deficits and impairments:       Psychosocial Skills: Coping Strategies, Habits   Visit Diagnosis: Difficulty coping    Problem List Patient Active Problem List   Diagnosis Date Noted   Borderline personality disorder (HCC) 12/31/2023   Generalized anxiety disorder 12/15/2023   Social anxiety disorder 10/28/2023   Recurrent major depressive disorder, in partial remission (HCC) 10/28/2023   Hx of bipolar disorder  10/28/2023   Long term current use of cannabis 10/28/2023   High risk medication use 10/28/2023   Vitamin D deficiency 08/22/2023   MDD (major depressive disorder), recurrent severe, without psychosis (HCC) 08/10/2023   Major depressive disorder, recurrent episode with mixed features (HCC) 08/03/2023   Anxiety and depression 05/29/2023   Sexual dysfunction 05/29/2023   PTSD (post-traumatic stress disorder) 05/15/2023   Nightmares 05/15/2023   Reactive depression 05/15/2023   Acute appendicitis with localized peritonitis 11/19/2022   MDD (major depressive disorder), single episode, severe , no psychosis (HCC) 12/19/2020   Overdose 12/19/2020   Suicide ideation 12/19/2020   Bipolar II disorder (HCC) 12/19/2020    Ted Mcalpine, OT 01/13/2024, 9:04 AM  Kerrin Champagne, OT   [facility] HOSPITALIZATION PROGRAM [address] [place], [place], [address] Phone: [phone]   Fax:  [phone]  Name: NICCI VAUGHAN MRN: [id number] Date of Birth: [birth year]

## 2024-01-14 ENCOUNTER — Telehealth (HOSPITAL_COMMUNITY): Payer: Self-pay | Admitting: Psychiatry

## 2024-01-14 ENCOUNTER — Encounter (HOSPITAL_COMMUNITY): Payer: Self-pay

## 2024-01-14 NOTE — Therapy (Signed)
 [facility] PARTIAL HOSPITALIZATION PROGRAM [address] [place], [place], [address] Phone: [phone]   Fax:  [phone]  Occupational Therapy Treatment Virtual Visit via Video Note  I connected with Amorah A Sternberg on 01/14/24 at  8:00 AM EDT by a video enabled telemedicine application and verified that I am speaking with the correct person using two identifiers.  Location: Patient: home Provider: office   I discussed the limitations of evaluation and management by telemedicine and the availability of in person appointments. The patient expressed understanding and agreed to proceed.    The patient was advised to call back or seek an in-person evaluation if the symptoms worsen or if the condition fails to improve as anticipated.  I provided 55 minutes of non-face-to-face time during this encounter.   Patient Details  Name: Donna Wagner MRN: [id number] Date of Birth: [birth year] No data recorded  Encounter Date: 01/07/2024   OT End of Session - 01/14/24 1123     Visit Number 14    Number of Visits 20    Date for OT Re-Evaluation 01/18/24    OT Start Time 1200    OT Stop Time 1255    OT Time Calculation (min) 55 min    Activity Tolerance Patient tolerated treatment well             Past Medical History:  Diagnosis Date   Anxiety    Bipolar disorder (HCC)    Depression     Past Surgical History:  Procedure Laterality Date   APPENDECTOMY     XI ROBOTIC LAPAROSCOPIC ASSISTED APPENDECTOMY N/A 11/19/2022   Procedure: XI ROBOTIC LAPAROSCOPIC ASSISTED APPENDECTOMY;  Surgeon: Carolan Shiver, MD;  Location: [facility] ORS;  Service: General;  Laterality: N/A;    There were no vitals filed for this visit.   Subjective Assessment - 01/14/24 1123     Currently in Pain? No/denies    Pain Score 0-No pain                Group Session:  S: Feeling good today.   O: During today's OT group session, the patient participated  in an educational segment about the importance of goal-setting and the application of the SMART framework to enhance daily life, particularly focusing on ADLs and iADLs. The session began with five open-ended pre-session questions that facilitated group discussion and introspection about their current relationship with goals. Following the introduction and educational segment, participants engaged in brainstorming and group discussions to devise hypothetical SMART goals. The session concluded with five post-session questions to reinforce understanding and facilitate reflection. Throughout the session, there was a range of engagement levels noted among the participants.   A:  Patient demonstrated a high level of engagement throughout the session. They actively participated in discussions, sharing personal experiences related to goal setting and challenges faced. Patient was able to clearly articulate an understanding of the SMART framework and proposed personal SMART goals related to their own ADLs with minimal assistance. They expressed enthusiasm about applying what they learned to their daily routine and appeared motivated to make changes.    P: Continue to attend PHP OT group sessions 5x week for 4 weeks to promote daily structure, social engagement, and opportunities to develop and utilize adaptive strategies to maximize functional performance in preparation for safe transition and integration back into school, work, and the community. Plan to address topic of SMART Goals in next OT group session.  OT Education - 01/14/24 1123     Education Details SMART Goals              OT Short Term Goals - 12/19/23 0955       OT SHORT TERM GOAL #1   Title Patient will be educated on strategies to improve psychosocial skills needed to participate fully in all daily, work, and leisure activities.    Time 4    Period Weeks    Status On-going    Target Date 01/18/24       OT SHORT TERM GOAL #2   Title Pt will apply psychosocial skills and coping mechanisms to daily activities in order to function independently and reintegrate into community dwelling    Status On-going      OT SHORT TERM GOAL #3   Title Pt will choose and/or engage in 1-3 socially engaging leisure activities to improve social participation skills upon reintegrating into community    Status On-going      OT SHORT TERM GOAL #4   Title Patient will be educated on strategies to improve psychosocial skills needed to participate fully in all daily, work, and leisure activities.    Status On-going                      Plan - 01/14/24 1123     Psychosocial Skills Coping Strategies;Habits             Patient will benefit from skilled therapeutic intervention in order to improve the following deficits and impairments:       Psychosocial Skills: Coping Strategies, Habits   Visit Diagnosis: Difficulty coping    Problem List Patient Active Problem List   Diagnosis Date Noted   Borderline personality disorder (HCC) 12/31/2023   Generalized anxiety disorder 12/15/2023   Social anxiety disorder 10/28/2023   Recurrent major depressive disorder, in partial remission (HCC) 10/28/2023   Hx of bipolar disorder 10/28/2023   Long term current use of cannabis 10/28/2023   High risk medication use 10/28/2023   Vitamin D deficiency 08/22/2023   MDD (major depressive disorder), recurrent severe, without psychosis (HCC) 08/10/2023   Major depressive disorder, recurrent episode with mixed features (HCC) 08/03/2023   Anxiety and depression 05/29/2023   Sexual dysfunction 05/29/2023   PTSD (post-traumatic stress disorder) 05/15/2023   Nightmares 05/15/2023   Reactive depression 05/15/2023   Acute appendicitis with localized peritonitis 11/19/2022   MDD (major depressive disorder), single episode, severe , no psychosis (HCC) 12/19/2020   Overdose 12/19/2020   Suicide ideation  12/19/2020   Bipolar II disorder (HCC) 12/19/2020    Ted Mcalpine, OT 01/14/2024, 11:24 AM  Kerrin Champagne, OT   [facility] HOSPITALIZATION PROGRAM [address] [place], [place], [address] Phone: [phone]   Fax:  [phone]  Name: DIONISIA PACHOLSKI MRN: [id number] Date of Birth: 08/15/2006

## 2024-01-15 ENCOUNTER — Encounter (HOSPITAL_COMMUNITY): Payer: Self-pay

## 2024-01-15 ENCOUNTER — Other Ambulatory Visit (HOSPITAL_COMMUNITY): Attending: Psychiatry | Admitting: Psychiatry

## 2024-01-15 ENCOUNTER — Telehealth (HOSPITAL_COMMUNITY): Payer: Self-pay | Admitting: Psychiatry

## 2024-01-15 ENCOUNTER — Encounter (HOSPITAL_COMMUNITY): Payer: Self-pay | Admitting: Psychiatry

## 2024-01-15 DIAGNOSIS — G4700 Insomnia, unspecified: Secondary | ICD-10-CM | POA: Insufficient documentation

## 2024-01-15 DIAGNOSIS — F419 Anxiety disorder, unspecified: Secondary | ICD-10-CM | POA: Insufficient documentation

## 2024-01-15 DIAGNOSIS — F332 Major depressive disorder, recurrent severe without psychotic features: Secondary | ICD-10-CM | POA: Diagnosis present

## 2024-01-15 DIAGNOSIS — F4310 Post-traumatic stress disorder, unspecified: Secondary | ICD-10-CM | POA: Insufficient documentation

## 2024-01-15 DIAGNOSIS — F1210 Cannabis abuse, uncomplicated: Secondary | ICD-10-CM | POA: Insufficient documentation

## 2024-01-15 DIAGNOSIS — Z79899 Other long term (current) drug therapy: Secondary | ICD-10-CM | POA: Diagnosis not present

## 2024-01-15 DIAGNOSIS — F3341 Major depressive disorder, recurrent, in partial remission: Secondary | ICD-10-CM

## 2024-01-15 NOTE — Telephone Encounter (Signed)
 D: Placed call to check in on pt d/t her leaving group at 11:15 a.m. Pt states she left d/t group topic (codependency). "I started spiraling and didn't want to cry on my first day." A: Provided pt with support. Informed her that group is a safe place to cry and express emotions.  Discussed safety options (ie. Calling 988 or going to [facility]) if SI should arise.  Pt denies SI/HI or A/V hallucinations.  Inform treatment team.  R:  Pt receptive.
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
## 2024-01-16 ENCOUNTER — Other Ambulatory Visit (HOSPITAL_COMMUNITY)

## 2024-01-16 ENCOUNTER — Telehealth (HOSPITAL_COMMUNITY): Payer: Self-pay | Admitting: Psychiatry

## 2024-01-16 MED ORDER — LAMOTRIGINE 25 MG PO TABS: 50.0000 mg | ORAL_TABLET | Freq: Every day | ORAL | 0 refills | Status: DC

## 2024-01-16 NOTE — Progress Notes (Signed)
 Refilled Lamictal 50mg  daily.

## 2024-01-16 NOTE — Addendum Note (Signed)
 Addended by: Oneta Rack on: 01/16/2024 01:46 PM   Modules accepted: Orders

## 2024-01-16 NOTE — Telephone Encounter (Signed)
 D:  Pt sent the case manager an email stating she wouldn't be attending MH-IOP today d/t lack of sleep lastnight.  A:  Informed the treatment team.  R:  Pt receptive.

## 2024-01-18 ENCOUNTER — Encounter (HOSPITAL_COMMUNITY): Payer: Self-pay

## 2024-01-18 NOTE — Therapy (Signed)
 [facility] PARTIAL HOSPITALIZATION PROGRAM [address] [place], [place], [address] Phone: [phone]   Fax:  [phone]  Occupational Therapy Treatment Virtual Visit via Video Note  I connected with Donna Wagner on 01/18/24 at  8:00 AM EDT by a video enabled telemedicine application and verified that I am speaking with the correct person using two identifiers.  Location: Patient: home Provider: office   I discussed the limitations of evaluation and management by telemedicine and the availability of in person appointments. The patient expressed understanding and agreed to proceed.    The patient was advised to call back or seek an in-person evaluation if the symptoms worsen or if the condition fails to improve as anticipated.  I provided 55 minutes of non-face-to-face time during this encounter.   Patient Details  Name: Donna Wagner MRN: [id number] Date of Birth: 08-04-2006 No data recorded  Encounter Date: 01/12/2024   OT End of Session - 01/18/24 2037     Visit Number 15    Number of Visits 20    Date for OT Re-Evaluation 01/18/24    OT Start Time 1200    OT Stop Time 1255    OT Time Calculation (min) 55 min             Past Medical History:  Diagnosis Date   Anxiety    Bipolar disorder (HCC)    Depression     Past Surgical History:  Procedure Laterality Date   APPENDECTOMY     XI ROBOTIC LAPAROSCOPIC ASSISTED APPENDECTOMY N/A 11/19/2022   Procedure: XI ROBOTIC LAPAROSCOPIC ASSISTED APPENDECTOMY;  Surgeon: Carolan Shiver, MD;  Location: [facility] ORS;  Service: General;  Laterality: N/A;    There were no vitals filed for this visit.   Subjective Assessment - 01/18/24 2036     Currently in Pain? No/denies    Pain Score 0-No pain                Group Session:  S: doing better today.  O: The objective of the telehealth group therapy session was to discuss the significance of routines in promoting  mental health and wellbeing. The OT aimed to explore the power of routines in providing structure, stability, and predictability in individuals' lives, as well as their role in establishing healthy habits, reducing stress and anxiety, managing time effectively, achieving goals, and fostering a sense of community and social connectedness.   Participants were guided to identify areas where routines could improve their mental health and were provided with tips for establishing and maintaining healthy routines. The session concluded by emphasizing the potential of routines to enhance overall quality of life.  Homework Assignment:  As part of the session, participants were assigned a homework task to reflect on their current routines and select one area of their lives where they could establish a new routine to improve their mental health and wellbeing. They were instructed to start small and implement the routine gradually, while seeking support from friends, family, or mental health professionals as needed. The participants were asked to report their progress in the next therapy session, focusing on the benefits and challenges encountered during the implementation of their chosen routine.   A: During the telehealth group therapy session, the patient actively participated in the discussion on the importance of routines in promoting mental health and wellbeing. The patient demonstrated a good understanding of the power of routines in providing structure, stability, and predictability in their life. They were able to  identify areas in their life where routines could contribute to improving their overall mental health.    P: Continue to attend PHP OT group sessions 5x week for 4 weeks to promote daily structure, social engagement, and opportunities to develop and utilize adaptive strategies to maximize functional performance in preparation for safe transition and integration back into school, work, and the  community. Plan to address topic of pt 2 in next OT group session.                   OT Education - 01/18/24 2037     Education Details Routines              OT Short Term Goals - 12/19/23 0955       OT SHORT TERM GOAL #1   Title Patient will be educated on strategies to improve psychosocial skills needed to participate fully in all daily, work, and leisure activities.    Time 4    Period Weeks    Status On-going    Target Date 01/18/24      OT SHORT TERM GOAL #2   Title Pt will apply psychosocial skills and coping mechanisms to daily activities in order to function independently and reintegrate into community dwelling    Status On-going      OT SHORT TERM GOAL #3   Title Pt will choose and/or engage in 1-3 socially engaging leisure activities to improve social participation skills upon reintegrating into community    Status On-going      OT SHORT TERM GOAL #4   Title Patient will be educated on strategies to improve psychosocial skills needed to participate fully in all daily, work, and leisure activities.    Status On-going                      Plan - 01/18/24 2037     Psychosocial Skills Coping Strategies;Habits             Patient will benefit from skilled therapeutic intervention in order to improve the following deficits and impairments:       Psychosocial Skills: Coping Strategies, Habits   Visit Diagnosis: Difficulty coping    Problem List Patient Active Problem List   Diagnosis Date Noted   Borderline personality disorder (HCC) 12/31/2023   Generalized anxiety disorder 12/15/2023   Social anxiety disorder 10/28/2023   Recurrent major depressive disorder, in partial remission (HCC) 10/28/2023   Hx of bipolar disorder 10/28/2023   Long term current use of cannabis 10/28/2023   High risk medication use 10/28/2023   Vitamin D deficiency 08/22/2023   MDD (major depressive disorder), recurrent severe, without psychosis  (HCC) 08/10/2023   Major depressive disorder, recurrent episode with mixed features (HCC) 08/03/2023   Anxiety and depression 05/29/2023   Sexual dysfunction 05/29/2023   PTSD (post-traumatic stress disorder) 05/15/2023   Nightmares 05/15/2023   Reactive depression 05/15/2023   Acute appendicitis with localized peritonitis 11/19/2022   MDD (major depressive disorder), single episode, severe , no psychosis (HCC) 12/19/2020   Overdose 12/19/2020   Suicide ideation 12/19/2020   Bipolar II disorder (HCC) 12/19/2020    Ted Mcalpine, OT 01/18/2024, 8:37 PM  Kerrin Champagne, OT   [facility] HOSPITALIZATION PROGRAM [address] [place], [place], [address] Phone: [phone]   Fax:  [phone]  Name: Donna Wagner MRN: [id number] Date of Birth: February 16, 2006

## 2024-01-18 NOTE — Therapy (Signed)
 [facility] PARTIAL HOSPITALIZATION PROGRAM [address] [place], [place], [address] Phone: [phone]   Fax:  [phone]  Occupational Therapy Treatment Virtual Visit via Video Note  I connected with Donna Wagner on 01/18/24 at  8:00 AM EDT by a video enabled telemedicine application and verified that I am speaking with the correct person using two identifiers.  Location: Patient: home Provider: office   I discussed the limitations of evaluation and management by telemedicine and the availability of in person appointments. The patient expressed understanding and agreed to proceed.    The patient was advised to call back or seek an in-person evaluation if the symptoms worsen or if the condition fails to improve as anticipated.  I provided 55 minutes of non-face-to-face time during this encounter.   Patient Details  Name: Donna Wagner MRN: [id number] Date of Birth: Aug 18, 2006 No data recorded  Encounter Date: 01/13/2024   OT End of Session - 01/18/24 2045     Visit Number 16    Number of Visits 20    Date for OT Re-Evaluation 01/18/24    OT Start Time 1200    OT Stop Time 1255    OT Time Calculation (min) 55 min             Past Medical History:  Diagnosis Date   Anxiety    Bipolar disorder (HCC)    Depression     Past Surgical History:  Procedure Laterality Date   APPENDECTOMY     XI ROBOTIC LAPAROSCOPIC ASSISTED APPENDECTOMY N/A 11/19/2022   Procedure: XI ROBOTIC LAPAROSCOPIC ASSISTED APPENDECTOMY;  Surgeon: Carolan Shiver, MD;  Location: [facility] ORS;  Service: General;  Laterality: N/A;    There were no vitals filed for this visit.   Subjective Assessment - 01/18/24 2045     Currently in Pain? No/denies    Pain Score 0-No pain                 Group Session:  S: Doing better today. Optimistic for the future.   O: The objective of the telehealth group therapy session was to discuss the significance  of routines in promoting mental health and wellbeing. The OT aimed to explore the power of routines in providing structure, stability, and predictability in individuals' lives, as well as their role in establishing healthy habits, reducing stress and anxiety, managing time effectively, achieving goals, and fostering a sense of community and social connectedness.   Participants were guided to identify areas where routines could improve their mental health and were provided with tips for establishing and maintaining healthy routines. The session concluded by emphasizing the potential of routines to enhance overall quality of life.  Homework Assignment:  As part of the session, participants were assigned a homework task to reflect on their current routines and select one area of their lives where they could establish a new routine to improve their mental health and wellbeing. They were instructed to start small and implement the routine gradually, while seeking support from friends, family, or mental health professionals as needed. The participants were asked to report their progress in the next therapy session, focusing on the benefits and challenges encountered during the implementation of their chosen routine.   A: The patient showed motivation and willingness to reflect on their current habits and behaviors, recognizing the need for positive changes. They actively engaged in the session, sharing personal experiences and challenges related to establishing and maintaining healthy routines. The patient expressed a  desire to reduce stress and anxiety, manage their time more effectively, and achieve their goals through the implementation of routines.    P: Continue to attend PHP OT group sessions 5x week for 4 weeks to promote daily structure, social engagement, and opportunities to develop and utilize adaptive strategies to maximize functional performance in preparation for safe transition and integration  back into school, work, and the community. Plan to address topic of pt 3 in next OT group session.                  OT Education - 01/18/24 2045     Education Details Routines              OT Short Term Goals - 12/19/23 0955       OT SHORT TERM GOAL #1   Title Patient will be educated on strategies to improve psychosocial skills needed to participate fully in all daily, work, and leisure activities.    Time 4    Period Weeks    Status On-going    Target Date 01/18/24      OT SHORT TERM GOAL #2   Title Pt will apply psychosocial skills and coping mechanisms to daily activities in order to function independently and reintegrate into community dwelling    Status On-going      OT SHORT TERM GOAL #3   Title Pt will choose and/or engage in 1-3 socially engaging leisure activities to improve social participation skills upon reintegrating into community    Status On-going      OT SHORT TERM GOAL #4   Title Patient will be educated on strategies to improve psychosocial skills needed to participate fully in all daily, work, and leisure activities.    Status On-going                      Plan - 01/18/24 2045     Psychosocial Skills Coping Strategies;Habits             Patient will benefit from skilled therapeutic intervention in order to improve the following deficits and impairments:       Psychosocial Skills: Coping Strategies, Habits   Visit Diagnosis: Difficulty coping    Problem List Patient Active Problem List   Diagnosis Date Noted   Borderline personality disorder (HCC) 12/31/2023   Generalized anxiety disorder 12/15/2023   Social anxiety disorder 10/28/2023   Recurrent major depressive disorder, in partial remission (HCC) 10/28/2023   Hx of bipolar disorder 10/28/2023   Long term current use of cannabis 10/28/2023   High risk medication use 10/28/2023   Vitamin D deficiency 08/22/2023   MDD (major depressive disorder),  recurrent severe, without psychosis (HCC) 08/10/2023   Major depressive disorder, recurrent episode with mixed features (HCC) 08/03/2023   Anxiety and depression 05/29/2023   Sexual dysfunction 05/29/2023   PTSD (post-traumatic stress disorder) 05/15/2023   Nightmares 05/15/2023   Reactive depression 05/15/2023   Acute appendicitis with localized peritonitis 11/19/2022   MDD (major depressive disorder), single episode, severe , no psychosis (HCC) 12/19/2020   Overdose 12/19/2020   Suicide ideation 12/19/2020   Bipolar II disorder (HCC) 12/19/2020    Ted Mcalpine, OT 01/18/2024, 8:46 PM  Kerrin Champagne, OT   [facility] HOSPITALIZATION PROGRAM [address] [place], [place], [address] Phone: [phone]   Fax:  [phone]  Name: Donna Wagner MRN: [id number] Date of Birth: [birth year]

## 2024-01-19 ENCOUNTER — Telehealth (HOSPITAL_COMMUNITY): Payer: Self-pay | Admitting: Psychiatry

## 2024-01-19 ENCOUNTER — Other Ambulatory Visit (HOSPITAL_COMMUNITY): Admitting: Psychiatry

## 2024-01-19 DIAGNOSIS — F332 Major depressive disorder, recurrent severe without psychotic features: Secondary | ICD-10-CM | POA: Diagnosis not present

## 2024-01-19 DIAGNOSIS — F4310 Post-traumatic stress disorder, unspecified: Secondary | ICD-10-CM
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
## 2024-01-19 NOTE — Psych (Signed)
 Virtual Visit via Video Note  I connected with Donna Wagner on 01/13/24 at  9:00 AM EDT by a video enabled telemedicine application and verified that I am speaking with the correct person using two identifiers.  Location: Patient: patient home Provider: clinical home office   I discussed the limitations of evaluation and management by telemedicine and the availability of in person appointments. The patient expressed understanding and agreed to proceed.  I discussed the assessment and treatment plan with the patient. The patient was provided an opportunity to ask questions and all were answered. The patient agreed with the plan and demonstrated an understanding of the instructions.   The patient was advised to call back or seek an in-person evaluation if the symptoms worsen or if the condition fails to improve as anticipated.  Pt was provided 240 minutes of non-face-to-face time during this encounter.   [name], LCSW   [facility] PHP THERAPIST PROGRESS NOTE  Donna Wagner [id number]  Session Time: 9:00 - 10:00  Participation Level: Active  Behavioral Response: CasualAlertDepressed  Type of Therapy: Group Therapy  Treatment Goals addressed: Coping  Progress Towards Goals: Progressing  Interventions: CBT, DBT, Supportive, and Reframing  Summary: Donna Wagner is a 18 y.o. female who presents with depression symptoms.  Clinician led check-in regarding current stressors and situation, and review of patient completed daily inventory. Clinician utilized active listening and empathetic response and validated patient emotions. Clinician facilitated processing group on pertinent issues.   Therapist Response: Patient arrived within time allowed. Patient rates her mood at a 4 on a scale of 1-10 with 10 being best. Pt states she feels "really tired." Pt states she slept 9 hours and ate 3x. Pt reports nightmares interrupted her sleep. Pt states she had a good day yesterday  and got out of the house and crocheted. Pt reports appetite is returning. Patient able to process. Patient engaged in discussion.          Session Time: 10:00 am - 11:00 am   Participation Level: Active   Behavioral Response: CasualAlertDepressed   Type of Therapy: Group Therapy   Treatment Goals addressed: Coping   Progress Towards Goals: Progressing   Interventions: CBT, DBT, Solution Focused, Strength-based, Supportive, and Reframing   Summary: [name] led discussion on negative self-talk and how it affects [place]. [name] utilized CBT to discuss how thoughts shape our feelings and actions. Group members shared how negative thinking affects them and worked to reframe their negative thinking.    Therapist Response: Pt engaged in discussion and reports understanding.            Session Time: 11:00 -12:00   Participation Level: Active   Behavioral Response: CasualAlertDepressed   Type of Therapy: Group Therapy   Treatment Goals addressed: Coping   Progress Towards Goals: Progressing   Interventions: CBT, DBT, Solution Focused, Strength-based, Supportive, and Reframing   Summary: [name] introduced the "Thoughts" distraction skills from DBT distress tolerance skill ACCEPTS. [name] discussed how this set of distraction skills can be helpful when in situations where outside resources are limited or unavailable. Group practiced and brainstormed ways to apply the Thought skill.    Therapist Response: Pt engaged in discussion and is able to identify ways to apply the skill.              Session Time: 12:00 -1:00   Participation Level: Active   Behavioral Response: CasualAlertDepressed   Type of Therapy: Group therapy   Treatment Goals addressed: Coping  Progress Towards Goals: Progressing   Interventions: OT group   Summary: 12:00 - 12:50: Occupational Therapy group with cln E. Hollan.  12:50 - 1:00 Clinician assessed for immediate needs, medication compliance and efficacy,  and safety concerns.   Therapist Response: 12:00 - 12:50: See note 12:50 - 1:00 pm: At check-out, patient reports no immediate concerns. Patient demonstrates progress as evidenced by continued engagement and responsiveness to treatment. Patient denies SI/HI/self-harm thoughts at the end of group.   Suicidal/Homicidal: Nowithout intent/plan   Plan: Pt will discharge from PHP due to meeting treatment goals of decreased depression symptoms, increased emotion regulation, and increased ability to manage symptoms in a healthy manner. Pt will step down to IOP within this agency beginning 3/27. Pt and provider are aligned with discharge plan. Pt denies SI/HI at time of discharge.   Collaboration of Care: Medication Management AEB Corrie Mckusick, MD  Patient/Guardian was advised Release of Information must be obtained prior to any record release in order to collaborate their care with an outside provider. Patient/Guardian was advised if they have not already done so to contact the registration department to sign all necessary forms in order for [place] to release information regarding their care.   Consent: Patient/Guardian gives verbal consent for treatment and assignment of benefits for services provided during this visit. Patient/Guardian expressed understanding and agreed to proceed.   Diagnosis: MDD (major depressive disorder), recurrent severe, without psychosis (HCC) [F33.2]    1. MDD (major depressive disorder), recurrent severe, without psychosis (HCC)   2. Borderline personality disorder (HCC)       [name], LCSW
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
## 2024-01-19 NOTE — Telephone Encounter (Signed)
 D:  According to group facilitator Noralee Stain, [place]), pt didn't return to virtual MH-IOP after break this morning.  A:  Placed call to pt, but there was no answer and her voicemail box is full.  Will attempt again later.  Inform Dr. Hazle Quant and treatment team.  Will also, touch bases with PHP to see if there's a pattern.

## 2024-01-19 NOTE — Psych (Signed)
 Virtual Visit via Video Note  I connected with Donna Wagner on 12/29/23 at  9:00 AM EDT by a video enabled telemedicine application and verified that I am speaking with the correct person using two identifiers.  Location: Patient: patient home Provider: clinical home office   I discussed the limitations of evaluation and management by telemedicine and the availability of in person appointments. The patient expressed understanding and agreed to proceed.  I discussed the assessment and treatment plan with the patient. The patient was provided an opportunity to ask questions and all were answered. The patient agreed with the plan and demonstrated an understanding of the instructions.   The patient was advised to call back or seek an in-person evaluation if the symptoms worsen or if the condition fails to improve as anticipated.  Pt was provided 240 minutes of non-face-to-face time during this encounter.   Donna Guiles, LCSW   [facility] PHP THERAPIST PROGRESS NOTE  NIKO JAKEL [id number]  Session Time: 9:00 - 10:00  Participation Level: Active  Behavioral Response: CasualAlertDepressed  Type of Therapy: Group Therapy  Treatment Goals addressed: Coping  Progress Towards Goals: Progressing  Interventions: CBT, DBT, Supportive, and Reframing  Summary: Donna Wagner is a 18 y.o. female who presents with depression symptoms.  Clinician led check-in regarding current stressors and situation, and review of patient completed daily inventory. Clinician utilized active listening and empathetic response and validated patient emotions. Clinician facilitated processing group on pertinent issues.   Therapist Response: Patient arrived within time allowed. Patient rates her mood at a 5 on a scale of 1-10 with 10 being best. Pt states she feels "stressed." Pt states she slept 11 hours and ate 3x. 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 Pt reports additional stressor of issue with an online friend. Pt is able to process through issue. Pt reports sleeping through much of the weekend to cope with feeling "bummed out" and stressed. Patient able to process. Patient engaged in discussion.          Session Time: 10:00 am - 11:00 am   Participation Level: Active   Behavioral Response: CasualAlertDepressed   Type of Therapy: Group Therapy   Treatment Goals addressed: Coping   Progress Towards Goals: Progressing   Interventions: CBT, DBT, Solution Focused, Strength-based, Supportive, and Reframing   Summary: Donna led processing group for pt's current struggles. Group members shared stressors and provided support and feedback. Donna brought in topics of boundaries, healthy relationships, and unhealthy thought processes to inform discussion.    Therapist Response: Pt able to process and provide support to group.            Session Time: 11:00 -12:00   Participation Level: Active   Behavioral Response: CasualAlertDepressed   Type of Therapy: Group Therapy   Treatment Goals addressed: Coping   Progress Towards Goals: Progressing   Interventions: CBT, DBT, Solution Focused, Strength-based, Supportive, and Reframing   Summary: Donna introduced DBT emotion regulation skill, PLEASE. Donna discussed the importance of taking care of our whole body as a way to aid in stabilizing emotions. Group discussed ways they struggle to manage the elements of PLEASE and how to remove barriers.    Therapist Response:  Pt engaged in discussion and is able to identify ways to utilize PLEASE skill.  Session Time: 12:00 -1:00   Participation Level: Active   Behavioral Response: CasualAlertDepressed   Type of Therapy: Group therapy   Treatment Goals addressed: Coping   Progress Towards Goals: Progressing    Interventions: OT group   Summary: 12:00 - 12:50: Occupational Therapy group with Donna E. Wagner.  12:50 - 1:00 Clinician assessed for immediate needs, medication compliance and efficacy, and safety concerns.   Therapist Response: 12:00 - 12:50: See note 12:50 - 1:00 pm: At check-out, patient reports no immediate concerns. Patient demonstrates progress as evidenced by continued engagement and responsiveness to treatment. Patient denies SI/HI/self-harm thoughts at the end of group.   Suicidal/Homicidal: Nowithout intent/plan   Plan: Pt will continue in PHP while working to decrease depression symptoms, increase emotion regulation, and increase ability to manage symptoms in a healthy manner.   Collaboration of Care: Medication Management AEB Corrie Mckusick, MD  Patient/Guardian was advised Release of Information must be obtained prior to any record release in order to collaborate their care with an outside provider. Patient/Guardian was advised if they have not already done so to contact the registration department to sign all necessary forms in order for Donna Wagner to release information regarding their care.   Consent: Patient/Guardian gives verbal consent for treatment and assignment of benefits for services provided during this visit. Patient/Guardian expressed understanding and agreed to proceed.   Diagnosis: MDD (major depressive disorder), recurrent severe, without psychosis (HCC) [F33.2]    1. MDD (major depressive disorder), recurrent severe, without psychosis (HCC)   2. Borderline personality disorder (HCC)       Donna Guiles, LCSW

## 2024-01-19 NOTE — Psych (Signed)
 Virtual Visit via Video Note  I connected with Donna Wagner on 12/26/23 at  9:00 AM EST by a video enabled telemedicine application and verified that I am speaking with the correct person using two identifiers.  Location: Patient: patient home Provider: clinical home office   I discussed the limitations of evaluation and management by telemedicine and the availability of in person appointments. The patient expressed understanding and agreed to proceed.  I discussed the assessment and treatment plan with the patient. The patient was provided an opportunity to ask questions and all were answered. The patient agreed with the plan and demonstrated an understanding of the instructions.   The patient was advised to call back or seek an in-person evaluation if the symptoms worsen or if the condition fails to improve as anticipated.  Pt was provided 120 minutes of non-face-to-face time during this encounter.   Donna Guiles, LCSW   [facility] PHP THERAPIST PROGRESS NOTE  Donna Wagner [id number]  Session Time: 9:00 - 10:00  Participation Level: Active  Behavioral Response: CasualAlertDepressed  Type of Therapy: Group Therapy  Treatment Goals addressed: Coping  Progress Towards Goals: Progressing  Interventions: CBT, DBT, Supportive, and Reframing  Summary: Donna Wagner is a 18 y.o. female who presents with depression symptoms.  Clinician led check-in regarding current stressors and situation, and review of patient completed daily inventory. Clinician utilized active listening and empathetic response and validated patient emotions. Clinician facilitated processing group on pertinent issues.   Therapist Response: Patient arrived within time allowed. Patient rates her mood at a 6 on a scale of 1-10 with 10 being best. Pt states she feels "good." Pt states she slept 7.5 hours and ate 2x. Pt reports she is feeling "very groggy" and struggling to keep her eyes open. Pt reports  yesterday went well and she met a creative daily goal, and spent time outside. Pt reports her 2 friends tried to put her in the middle of their argument which was stressful and led to a crying spell. Pt shares struggle with emotion regulation and worries about managing over the weekend. Patient able to process. Patient engaged in discussion.          Session Time: 10:00 am - 11:00 am   Participation Level: Active   Behavioral Response: CasualAlertDepressed   Type of Therapy: Group Therapy   Treatment Goals addressed: Coping   Progress Towards Goals: Progressing   Interventions: CBT, DBT, Solution Focused, Strength-based, Supportive, and Reframing   Summary: [name] led discussion on ways to manage stressors and feelings over the weekend. Group members  brainstormed things to do over the weekend for multiple levels of energy, access, and moods. [name] reviewed crisis services should they be needed and provided pt's with the text crisis line, mobile crisis, national suicide hotline, [facility] 24/7 line, and information on [facility].      Therapist Response: Pt engaged in discussion and is able to identify 3 ideas of what to do over the weekend to keep their mind engaged.     **Pt chose to leave group at 11 due to feeling groggy with difficulty remaining awake. Pt denies SI/HI before leaving session.    Suicidal/Homicidal: Nowithout intent/plan   Plan: Pt will continue in PHP while working to decrease depression symptoms, increase emotion regulation, and increase ability to manage symptoms in a healthy manner.   Collaboration of Care: Medication Management AEB Corrie Mckusick, MD  Patient/Guardian was advised Release of Information must be obtained prior to  any record release in order to collaborate their care with an outside provider. Patient/Guardian was advised if they have not already done so to contact the registration department to sign all necessary forms in order for [place] to release information  regarding their care.   Consent: Patient/Guardian gives verbal consent for treatment and assignment of benefits for services provided during this visit. Patient/Guardian expressed understanding and agreed to proceed.   Diagnosis: MDD (major depressive disorder), recurrent severe, without psychosis (HCC) [F33.2]    1. MDD (major depressive disorder), recurrent severe, without psychosis (HCC)       Donna Guiles, LCSW

## 2024-01-19 NOTE — Psych (Signed)
 Virtual Visit via Video Note  I connected with Donna Wagner on 12/30/23 at  9:00 AM EDT by a video enabled telemedicine application and verified that I am speaking with the correct person using two identifiers.  Location: Patient: patient home Provider: clinical home office   I discussed the limitations of evaluation and management by telemedicine and the availability of in person appointments. The patient expressed understanding and agreed to proceed.  I discussed the assessment and treatment plan with the patient. The patient was provided an opportunity to ask questions and all were answered. The patient agreed with the plan and demonstrated an understanding of the instructions.   The patient was advised to call back or seek an in-person evaluation if the symptoms worsen or if the condition fails to improve as anticipated.  Pt was provided 240 minutes of non-face-to-face time during this encounter.   [name], LCSW   [facility] PHP THERAPIST PROGRESS NOTE  Donna Wagner [id number]  Session Time: 9:00 - 10:00  Participation Level: Active  Behavioral Response: CasualAlertDepressed  Type of Therapy: Group Therapy  Treatment Goals addressed: Coping  Progress Towards Goals: Progressing  Interventions: CBT, DBT, Supportive, and Reframing  Summary: Donna Wagner is a 18 y.o. female who presents with depression symptoms.  Clinician led check-in regarding current stressors and situation, and review of patient completed daily inventory. Clinician utilized active listening and empathetic response and validated patient emotions. Clinician facilitated processing group on pertinent issues.   Therapist Response: Patient arrived within time allowed. Patient rates her mood at a 4 on a scale of 1-10 with 10 being best. Pt states she feels "groggy." Pt states she slept 9 hours and ate 2x. Pt reports she napped a total of 5 additional hours throughout the afternoon and had  dinner with her family. 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 Pt accepts feedback and support. Pt continues to struggle with negative self-talk. Patient able to process. Patient engaged in discussion.          Session Time: 10:00 am - 11:00 am   Participation Level: Active   Behavioral Response: CasualAlertDepressed   Type of Therapy: Group Therapy   Treatment Goals addressed: Coping   Progress Towards Goals: Progressing   Interventions: CBT, DBT, Solution Focused, Strength-based, Supportive, and Reframing   Summary: [name] continued DBT emotional regulation skills. Group discussed ways to apply opposite action, pleasant events, and checking the facts. [name] encouraged pt's to focus on keeping emotions stable not on being "happy."    Therapist Response: Pt engaged in discussion and is able to identify ways to utilize skills discussed.           Session Time: 11:00 -12:00   Participation Level: Active   Behavioral Response: CasualAlertDepressed   Type of Therapy: Group Therapy   Treatment Goals addressed: Coping   Progress Towards Goals: Progressing   Interventions: CBT, DBT, Solution Focused, Strength-based, Supportive, and Reframing   Summary: [name] introduced DBT distress tolerance skill IMPROVE.  [name] discussed how this set of skills are for when you have to sit through an undesirable feeling and wait for it to pass. Group discussed how to apply the IMPROVE skills to decrease distress at the undesired feeling.    Therapist Response: Pt engaged in discussion and is able to identify ways to apply the skill.              Session Time: 12:00 -1:00   Participation  Level: Active   Behavioral Response: CasualAlertDepressed   Type of Therapy: Group therapy   Treatment Goals addressed: Coping   Progress Towards Goals: Progressing   Interventions: OT group   Summary: 12:00 - 12:50: Occupational Therapy group with cln E.  Hollan.  12:50 - 1:00 Clinician assessed for immediate needs, medication compliance and efficacy, and safety concerns.   Therapist Response: 12:00 - 12:50: See note 12:50 - 1:00 pm: At check-out, patient reports no immediate concerns. Patient demonstrates progress as evidenced by continued engagement and responsiveness to treatment. Patient denies SI/HI/self-harm thoughts at the end of group.   Suicidal/Homicidal: Nowithout intent/plan   Plan: Pt will continue in PHP while working to decrease depression symptoms, increase emotion regulation, and increase ability to manage symptoms in a healthy manner.   Collaboration of Care: Medication Management AEB Corrie Mckusick, MD  Patient/Guardian was advised Release of Information must be obtained prior to any record release in order to collaborate their care with an outside provider. Patient/Guardian was advised if they have not already done so to contact the registration department to sign all necessary forms in order for [place] to release information regarding their care.   Consent: Patient/Guardian gives verbal consent for treatment and assignment of benefits for services provided during this visit. Patient/Guardian expressed understanding and agreed to proceed.   Diagnosis: MDD (major depressive disorder), recurrent severe, without psychosis (HCC) [F33.2]    1. MDD (major depressive disorder), recurrent severe, without psychosis (HCC)   2. Difficulty coping   3. Borderline personality disorder (HCC)       [name], LCSW

## 2024-01-20 ENCOUNTER — Other Ambulatory Visit (HOSPITAL_COMMUNITY): Attending: Psychiatry | Admitting: Family

## 2024-01-20 DIAGNOSIS — F332 Major depressive disorder, recurrent severe without psychotic features: Secondary | ICD-10-CM | POA: Insufficient documentation

## 2024-01-20 DIAGNOSIS — F4310 Post-traumatic stress disorder, unspecified: Secondary | ICD-10-CM

## 2024-01-20 DIAGNOSIS — F419 Anxiety disorder, unspecified: Secondary | ICD-10-CM | POA: Insufficient documentation

## 2024-01-20 MED ORDER — GABAPENTIN 100 MG PO CAPS
100.0000 mg | ORAL_CAPSULE | Freq: Two times a day (BID) | ORAL | 0 refills | Status: DC
Start: 2024-01-20 — End: 2024-02-19

## 2024-01-20 NOTE — Progress Notes (Signed)
 Virtual Visit via Video Note   I connected with Donna Wagner on 01/20/24 at  9:00 AM EDT by a video enabled telemedicine application and verified that I am speaking with the correct person using two identifiers.   At orientation to the IOP program, Case Manager discussed the limitations of evaluation and management by telemedicine and the availability of in person appointments. The patient expressed understanding and agreed to proceed with virtual visits throughout the duration of the program.   Location:  Patient: Patient Home Provider: OPT BH Office   History of Present Illness: MDD and PTSD   Observations/Objective: Check In: Case Manager checked in with all participants to review discharge dates, insurance authorizations, work-related documents and needs from the treatment team regarding medications. Donna Wagner stated needs and engaged in discussion.    Initial Therapeutic Activity: Counselor facilitated a check-in with Donna Wagner to assess for safety, sobriety and medication compliance.  Counselor also inquired about Donna Wagner's current emotional ratings, as well as any significant changes in thoughts, feelings or behavior since previous check in.  Donna Wagner presented for session on time and was alert, oriented x5, with no evidence or self-report of active SI/HI or A/V H.  Donna Wagner reported compliance with medication and denied use of alcohol.  Donna Wagner reported continued use of daily THC.  Donna Wagner reported scores of 3/10 for depression, 3/10 for anxiety, and 0/10 for anger/irritability.  Donna Wagner denied any recent outbursts or panic attacks.  Donna Wagner reported that a struggle was falling asleep yesterday during group, which is why she didn't return after break.  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  Donna Wagner reported that her goal today is to do some cleaning around the home.     Second Therapeutic Activity: Counselor introduced [employer], [place] Chaplain to  provide psychoeducation on topic of Grief and Loss with members today.  Donna Wagner began discussion by checking in with the group about their baseline mood today, general thoughts on what grief means to them and how it has affected them personally in the past.  Donna Wagner provided information on how the process of grief/loss can differ depending upon one's unique culture, and categories of loss one could experience (i.e. loss of a person, animal, relationship, job, identity, etc).  Donna Wagner encouraged members to be mindful of how pervasive loss can be, and how to recognize signs which could indicate that this is having an impact on one's overall mental health and wellbeing.  Intervention effectiveness was mixed, as client did answer an 'icebreaker' question posed by chaplain, but didn't participate in discussion on topic of grief.    Third Therapeutic Activity: Counselor continued discussion upon topic of self-care today with group members.  Counselor virtually shared self-care assessment form with members via Webex to complete final sub-categories (i.e. social, spiritual, and professional).  Members were asked to rank their engagement in the activities listed for each dimension on a scale of 1-3, with 1 indicating 'Poor', 2 indicating 'Ok', and 3 indicating 'Well'.  Counselor invited members to share results of this assessment, and inquired about which areas of self-care they are doing well in, as well as areas that require attention, and how they plan to begin addressing this during treatment.  Intervention was effective, as evidenced by Donna Wagner successfully completing final sections of assessment and actively engaging in discussion on subject, reporting that she is excelling in areas such as having stimulating conversations, doing enjoyable activities with other people, asking for help, taking on  projects that are rewarding, and learning new things related to her profession, but would benefit from focusing more on areas  such as meeting new people, talking to old friends, taking breaks, and maintaining a comfortable workspace.  Donna Wagner reported that she would work to improve self-care deficits by increasing confidence through therapy in order to branch out and meet new people to replace the ones she set boundaries with, going to different craft events in the community to meet people with common interests, taking breaks in between projects to avoid burnout, and rearranging her room to ensure space for a dedicated work/craft station.    Assessment and Plan: Counselor recommends that Donna Wagner remain in IOP treatment to better manage mental health symptoms, ensure stability and pursue completion of treatment plan goals. Counselor recommends adherence to crisis/safety plan, taking medications as prescribed, and following up with medical professionals if any issues arise.    Follow Up Instructions: Counselor will send Webex link for session tomorrow.  Donna Wagner was advised to call back or seek an in-person evaluation if the symptoms worsen or if the condition fails to improve as anticipated.   Collaboration of Care:   Medication Management AEB Hillery Jacks, NP or Dr. Park Pope                                          Case Manager AEB Jeri Modena, CNA    Patient/Guardian was advised Release of Information must be obtained prior to any record release in order to collaborate their care with an outside provider. Patient/Guardian was advised if they have not already done so to contact the registration department to sign all necessary forms in order for [place] to release information regarding their care.    Consent: Patient/Guardian gives verbal consent for treatment and assignment of benefits for services provided during this visit. Patient/Guardian expressed understanding and agreed to proceed.   I provided 180 minutes of non-face-to-face time during this encounter.   Noralee Stain, LCSW, LCAS 01/20/24

## 2024-01-20 NOTE — Progress Notes (Signed)
 Medication was refilled.

## 2024-01-21 ENCOUNTER — Other Ambulatory Visit (HOSPITAL_COMMUNITY): Admitting: Licensed Clinical Social Worker

## 2024-01-21 DIAGNOSIS — F332 Major depressive disorder, recurrent severe without psychotic features: Secondary | ICD-10-CM

## 2024-01-21 DIAGNOSIS — F4310 Post-traumatic stress disorder, unspecified: Secondary | ICD-10-CM

## 2024-01-21 NOTE — Progress Notes (Signed)
 Virtual Visit via Video Note   I connected with Machaela A. [place] on 01/21/24 at  9:00 AM EDT by a video enabled telemedicine application and verified that I am speaking with the correct person using two identifiers.   At orientation to the IOP program, Case Manager discussed the limitations of evaluation and management by telemedicine and the availability of in person appointments. The patient expressed understanding and agreed to proceed with virtual visits throughout the duration of the program.   Location:  Patient: Patient Home Provider: Home Office   History of Present Illness: MDD and PTSD   Observations/Objective: Check In: Case Manager checked in with all participants to review discharge dates, insurance authorizations, work-related documents and needs from the treatment team regarding medications. Ilsa stated needs and engaged in discussion.    Initial Therapeutic Activity: Counselor facilitated a check-in with Kendall to assess for safety, sobriety and medication compliance.  Counselor also inquired about Dereonna's current emotional ratings, as well as any significant changes in thoughts, feelings or behavior since previous check in.  Miah presented for session on time and was alert, oriented x5, with no evidence or self-report of active SI/HI or A/V H.  Bethanne reported compliance with medication and denied use of alcohol or illicit substances.  Oluwadamilola reported scores of 0/10 for depression, 6/10 for anxiety, and 0/10 for anger/irritability.  Timira denied any recent outbursts or panic attacks.  Rochelle reported that a struggle has been worrying about a date later today.  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  Morganne reported that her goal today is to do some self-care activities before the date.            Second Therapeutic Activity: Counselor introduced Peggye Fothergill, [employer]  Pharmacist, to provide psychoeducation on topic of medication compliance with members today.  Michelle Nasuti provided psychoeducation on classes of medications such as antidepressants, antipsychotics, what symptoms they are intended to treat, and any side effects one might encounter while on a particular prescription.  Time was allowed for clients to ask any questions they might have of [facility] regarding this specialty.  Intervention effectiveness could not be measured, as client did not participate.    Third Therapeutic Activity: Counselor covered topic of attachment styles today.  Counselor virtually shared a handout with the group on this topic which defined attachment styles as how people think about and behave in relationships.  Styles were broken down by category, including secure attachment where one believes close relationships are trustworthy, compared to insecure attachment (i.e. anxious, avoidant, or anxious-avoidant) where one is distrusting or worries about their bond with others.  Counselor inquired about which attachment style members most related to, how this has influenced their mental health/well-being, and whether they intend to begin making any changes.  Intervention was effective, as evidenced by [facility] participating in discussion, and reporting that she most identified with the anxious attachment style due to traits such as being distrustful of the partner and relationship, afraid of abandonment and rejection, as well as sensitive to criticism.  Darbi reported that her first serious relationship was not healthy, and created an unhealthy dynamic where she didn't get the love and attention she felt was deserved.  Laynee reported that this also fueled negative thoughts and beliefs.   Rayme reported that she plans to spend more time focused on her own needs during therapy to build up  self-esteem and determine the right kind of partner that supports her mental health, stating "I need to spend more time with  myself and become independent".    Assessment and Plan: Counselor recommends that Maziah remain in IOP treatment to better manage mental health symptoms, ensure stability and pursue completion of treatment plan goals. Counselor recommends adherence to crisis/safety plan, taking medications as prescribed, and following up with medical professionals if any issues arise.    Follow Up Instructions: Counselor will send Webex link for session tomorrow.  Shivaun was advised to call back or seek an in-person evaluation if the symptoms worsen or if the condition fails to improve as anticipated.   Collaboration of Care:   Medication Management AEB Hillery Jacks, NP or Dr. Carlyn Reichert                                          Case Manager AEB Jeri Modena, CNA    Patient/Guardian was advised Release of Information must be obtained prior to any record release in order to collaborate their care with an outside provider. Patient/Guardian was advised if they have not already done so to contact the registration department to sign all necessary forms in order for [place] to release information regarding their care.    Consent: Patient/Guardian gives verbal consent for treatment and assignment of benefits for services provided during this visit. Patient/Guardian expressed understanding and agreed to proceed.   I provided 180 minutes of non-face-to-face time during this encounter.   Noralee Stain, LCSW, LCAS 01/21/24

## 2024-01-22 ENCOUNTER — Other Ambulatory Visit (HOSPITAL_COMMUNITY): Admitting: Psychiatry

## 2024-01-22 ENCOUNTER — Telehealth (HOSPITAL_COMMUNITY): Payer: Self-pay | Admitting: Psychiatry

## 2024-01-22 ENCOUNTER — Ambulatory Visit: Admitting: Psychiatry

## 2024-01-22 DIAGNOSIS — F332 Major depressive disorder, recurrent severe without psychotic features: Secondary | ICD-10-CM

## 2024-01-22 DIAGNOSIS — F4310 Post-traumatic stress disorder, unspecified: Secondary | ICD-10-CM

## 2024-01-22 NOTE — Progress Notes (Signed)
**Note De-Identified Donna Obfuscation**  Virtual Visit Donna Video Note   I connected with Donna Wagner on 01/22/24 at  9:00 AM EDT by a video enabled telemedicine application and verified that I am speaking with the correct person using two identifiers.   At orientation to the IOP program, Case Manager discussed the limitations of evaluation and management by telemedicine and the availability of in person appointments. The patient expressed understanding and agreed to proceed with virtual visits throughout the duration of the program.   Location:  Patient: Patient Home Provider: OPT BH Office   History of Present Illness: MDD and PTSD   Observations/Objective: Check In: Case Manager checked in with all participants to review discharge dates, insurance authorizations, work-related documents and needs from the treatment team regarding medications. Donna Wagner stated needs and engaged in discussion.    Initial Therapeutic Activity: Counselor facilitated a check-in with Donna Wagner to assess for safety, sobriety and medication compliance.  Counselor also inquired about Donna Wagner's current emotional ratings, as well as any significant changes in thoughts, feelings or behavior since previous check in.  Donna Wagner presented for session on time and was alert, oriented x5, with no evidence or self-report of active SI/HI or A/V H.  Donna Wagner reported compliance with medication and denied use of alcohol.  She reported ongoing daily use of THC.  Donna Wagner reported scores of 2/10 for depression, 2/10 for anxiety, and 0/10 for anger/irritability.  Donna Wagner denied any recent outbursts or panic attacks.  Donna Wagner denied any new struggles.  Donna Wagner reported that a success was going on a 5 hour date with an old friend yesterday, stating "It went really well".  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            Second Therapeutic Activity: Counselor engaged the group in discussion on managing work/life balance today to improve mental health and wellness.  Counselor  explained how finding balance between responsibilities at home and work place can be challenging, and lead to increased stress.  Counselor facilitated discussion on what challenges members are currently, or have historically faced.  Counselor also discussed strategies for improving work/life balance while members work on their mental health during treatment.  Some of these included keeping track of time management; creating a list of priorities and scaling importance; setting realistic, measurable goals each day; establishing boundaries; taking care of health needs; and nurturing relationships at home and work for support.  Counselor inquired about areas where members feel they are excelling, as well as areas they could focus on during treatment. Intervention effectiveness could not be measured, as Donna Wagner was observed falling asleep during this discussion, and asked to leave group early to rest, as she could not focus on material.  Counselor allowed Donna Wagner to leave at 11:10am and informed care team.   Assessment and Plan: Counselor recommends that Donna Wagner remain in IOP treatment to better manage mental health symptoms, ensure stability and pursue completion of treatment plan goals. Counselor recommends adherence to crisis/safety plan, taking medications as prescribed, and following up with medical professionals if any issues arise.    Follow Up Instructions: Counselor will send Webex link for session tomorrow.  Donna Wagner was advised to call back or seek an in-person evaluation if the symptoms worsen or if the condition fails to improve as anticipated.   Collaboration of Care:   Medication Management AEB Hillery Jacks, NP or Dr. Carlyn Reichert  Case Manager AEB Jeri Modena, CNA    Patient/Guardian was advised Release of Information must be obtained prior to any record release in order to collaborate their care with an outside provider. Patient/Guardian was advised if they  have not already done so to contact the registration department to sign all necessary forms in order for [place] to release information regarding their care.    Consent: Patient/Guardian gives verbal consent for treatment and assignment of benefits for services provided during this visit. Patient/Guardian expressed understanding and agreed to proceed.   I provided 130 minutes of non-face-to-face time during this encounter.   Noralee Stain, LCSW, LCAS 01/22/24

## 2024-01-22 NOTE — Telephone Encounter (Signed)
 D:  Group Facilitator Noralee Stain, [place]) for virtual MH-IOP informed the case manager that pt logged off after break d/t "feeling tired."  A:  Placed call to check on patient.  Pt reiterated that she was feeling too tired to stay in group today.  "I didn't sleep very well last night."  Pt denies SI/HI or A/V hallucinations. Encouraged pt to practice sleep hygiene techniques tonight.  R:  Pt receptive.

## 2024-01-23 ENCOUNTER — Other Ambulatory Visit (HOSPITAL_COMMUNITY): Admitting: Psychiatry

## 2024-01-23 ENCOUNTER — Telehealth (HOSPITAL_COMMUNITY): Payer: Self-pay | Admitting: Psychiatry

## 2024-01-23 NOTE — Telephone Encounter (Signed)
 D:  Patient wasn't in group today.  Had sent the group facilitator an email stating she didn't sleep well lastnight d/t nightmares and feels tired/groggy this morning.  A:  Provided pt with support.  Reiterated to pt, she needs to attend group daily (in it's entirety) for it to work.  Will inform Hillery Jacks, NP.  R:  Pt receptive.

## 2024-01-24 ENCOUNTER — Other Ambulatory Visit: Payer: Self-pay | Admitting: Nurse Practitioner

## 2024-01-24 DIAGNOSIS — F4010 Social phobia, unspecified: Secondary | ICD-10-CM

## 2024-01-24 DIAGNOSIS — F3341 Major depressive disorder, recurrent, in partial remission: Secondary | ICD-10-CM

## 2024-01-24 DIAGNOSIS — F4310 Post-traumatic stress disorder, unspecified: Secondary | ICD-10-CM

## 2024-01-26 ENCOUNTER — Telehealth (HOSPITAL_COMMUNITY): Payer: Self-pay | Admitting: Psychiatry

## 2024-01-26 ENCOUNTER — Other Ambulatory Visit (HOSPITAL_COMMUNITY): Admitting: Psychiatry

## 2024-01-27 ENCOUNTER — Telehealth (HOSPITAL_COMMUNITY): Payer: Self-pay | Admitting: Psychiatry

## 2024-01-27 ENCOUNTER — Other Ambulatory Visit (HOSPITAL_COMMUNITY): Admitting: Psychiatry

## 2024-01-27 NOTE — Telephone Encounter (Signed)
 D:  Pt called the MH-IOP case manager back stating that she overslept again.  A:  Provided pt with support.  Informed pt that she has missed three days straight d/t oversleeping, etc; inquired if she wanted to transition back to her therapist since she's not able to attend this level of care (Virtual MH-IOP) on a regular basis?  Pt states she wants to continue.  "I still have something that I want to discuss and process in the group."  Informed pt d/t her non-compliancy with attendance, the case manager would need to staff this with the treatment team.  Will contact pt later today once cm speak with treatment team.  R:  Pt receptive.

## 2024-01-27 NOTE — Telephone Encounter (Signed)
 D:  Placed call to pt re: continued stay in virtual MH-IOP.  Informed pt that cm had spoken with the treatment team and all are in agreement that she can continue in group only if she doesn't miss any more days and stay for the groups entirety d/t her non-compliancy with attendance.  Pt replied, "that's understandably."  A:  Case Mgr provided pt with support and encouraged pt to be on time tomorrow and to set her alarm clock.  Inform treatment team.  R:  Pt receptive.

## 2024-01-28 ENCOUNTER — Other Ambulatory Visit (HOSPITAL_COMMUNITY): Admitting: Psychiatry

## 2024-01-28 DIAGNOSIS — F4310 Post-traumatic stress disorder, unspecified: Secondary | ICD-10-CM

## 2024-01-28 DIAGNOSIS — F332 Major depressive disorder, recurrent severe without psychotic features: Secondary | ICD-10-CM | POA: Diagnosis not present

## 2024-01-28 NOTE — Patient Instructions (Signed)
 D:  Patient states she feels ready to discharge.  A:  Follow up with Dr. Elna Breslow 03-02-24 @ 9:30 a.m and Renee Ramus, LCSW on 02-23-24 @ 10 a.m.  Strongly recommended support groups through [facility] [phone].  R:  Patient receptive.

## 2024-01-28 NOTE — Progress Notes (Signed)
 Virtual Visit via Video Note   I connected with Donna Wagner on 01/28/24 at  9:00 AM EDT by a video enabled telemedicine application and verified that I am speaking with the correct person using two identifiers.   At orientation to the IOP program, Case Manager discussed the limitations of evaluation and management by telemedicine and the availability of in person appointments. The patient expressed understanding and agreed to proceed with virtual visits throughout the duration of the program.   Location:  Patient: Patient Home Provider: Home Office   History of Present Illness: MDD and PTSD   Observations/Objective: Check In: Case Manager checked in with all participants to review discharge dates, insurance authorizations, work-related documents and needs from the treatment team regarding medications. Donna Wagner stated needs and engaged in discussion.    Initial Therapeutic Activity: Counselor facilitated a check-in with Donna Wagner to assess for safety, sobriety and medication compliance.  Counselor also inquired about Donna Wagner's current emotional ratings, as well as any significant changes in thoughts, feelings or behavior since previous check in.  Donna Wagner presented for session on time and was alert, oriented x5, with no evidence or self-report of active SI/HI or A/V H.  Donna Wagner reported compliance with medication and denied use of alcohol or illicit substances.  Donna Wagner reported scores of 7/10 for depression, 5/10 for anxiety, and 0/10 for anger/irritability.  Donna Wagner denied any recent outbursts or panic attacks.  Donna Wagner reported that a struggle has been waking up for group on time.  Case manager and counselor encouraged Donna Wagner to improve sleep hygiene in order to improve compliance with program expectations.  Donna Wagner reported that a success has been continuing to hang out with a new friend, which is going well.  Donna Wagner reported that her goal today is to do some packing, as she will be going out of town soon.             Second Therapeutic Activity: Counselor introduced topic of self-care today.  Counselor explained how this can be defined as the things one does to maintain good health and improve well-being.  Counselor provided members with a self-care assessment form to complete.  This handout featured various sub-categories of self-care, including physical, psychological/emotional, social, spiritual, and professional.  Members were asked to rank their engagement in the activities listed for each dimension on a scale of 1-3, with 1 indicating 'Poor', 2 indicating 'Ok', and 3 indicating 'Well'.  Counselor invited members to share results of their assessment, and inquired about which areas of self-care they are doing well in, as well as areas that require attention, and how they plan to begin addressing this during treatment.  Intervention effectiveness could not be measured, as Donna Wagner was observed falling asleep in the second half of session, and requested to leave early since she could not focus.  Counselor allowed her to leave when this was addressed at 11:12am and informed care team of ongoing issues with sleepiness impacting group engagement and attendance.     Assessment and Plan: Counselor recommends that Donna Wagner remain in IOP treatment to better manage mental health symptoms, ensure stability and pursue completion of treatment plan goals. Counselor recommends adherence to crisis/safety plan, taking medications as prescribed, and following up with medical professionals if any issues arise.    Follow Up Instructions: Counselor will send Webex link for session tomorrow.  Donna Wagner was advised to call back or seek an in-person evaluation if the symptoms worsen or if the condition fails to improve as anticipated.   Collaboration of  Care:   Medication Management AEB Donna Jacks, NP or Dr. Carlyn Wagner                                          Case Manager AEB Donna Modena, CNA    Patient/Guardian was advised Release of  Information must be obtained prior to any record release in order to collaborate their care with an outside provider. Patient/Guardian was advised if they have not already done so to contact the registration department to sign all necessary forms in order for [place] to release information regarding their care.    Consent: Patient/Guardian gives verbal consent for treatment and assignment of benefits for services provided during this visit. Patient/Guardian expressed understanding and agreed to proceed.   I provided 132 minutes of non-face-to-face time during this encounter.   Noralee Stain, LCSW, LCAS 01/28/24

## 2024-01-29 ENCOUNTER — Encounter (HOSPITAL_COMMUNITY): Payer: Self-pay | Admitting: Psychiatry

## 2024-01-29 ENCOUNTER — Other Ambulatory Visit (HOSPITAL_COMMUNITY)

## 2024-01-29 NOTE — Progress Notes (Signed)
 Virtual Visit via Video Note  I connected with Donna Wagner on @TODAY @ at  9:00 AM EDT by a video enabled telemedicine application and verified that I am speaking with the correct person using two identifiers.  Location: Patient: at home Provider: at office   I discussed the limitations of evaluation and management by telemedicine and the availability of in person appointments. The patient expressed understanding and agreed to proceed.  I discussed the assessment and treatment plan with the patient. The patient was provided an opportunity to ask questions and all were answered. The patient agreed with the plan and demonstrated an understanding of the instructions.   The patient was advised to call back or seek an in-person evaluation if the symptoms worsen or if the condition fails to improve as anticipated.  I provided 20 minutes of non-face-to-face time during this encounter.   Jeri Modena, M.Ed,CNA   Patient ID: Donna Wagner, female   DOB: May 12, 2006, 18 y.o.   MRN: [id number] D:  Pt decided that she was ready to transition back to a her psychiatrist and therapist since she hasn't been able to meet the group expectations with attending everyday and attending groups in their entirety.  "It's so difficult for me to stay awake whenever the clock hits 11 a.m.Marland Kitchen" Reports she has resources to help her, but feels she has "a ways to go" with her recovery.  On a scale of 1-10 (10 being the worst); pt rates her depression at a 4 and anxiety at a 5.  Denies SI/HI or A/V hallucinations. States the groups were helpful.  "It was great having other people to talk to. A:  Discharge pt today.  F/U with Dr. Elna Breslow on 03-02-24 @ 9:30 a.m.; Renee Ramus, LCSW on 02-23-24 @ 10 a.m.Marland Kitchen  Strongly encouraged support groups through [facility] [phone] and [employer]. Pt was advised of ROI must be obtained prior to any records release in order to collaborate her care with an  outside provider.  Pt was advised if she has not already done so to contact the front desk to sign all necessary forms in order for MH-IOP to release info re: her care.  Consent:  Pt gives verbal consent for tx and assignment of benefits for services provided during this telehealth group process.  Pt expressed understanding and agreed to proceed. Collaboration of care:  Collaborate with Hillery Jacks, NP AEB; Dr. Carlyn Reichert  AEB; Dr. Leighton Parody; and Noralee Stain, LCSW AEB; Renee Ramus, LCSW AEB.   R:  Pt receptive.  Jeri Modena, M.Ed,CNA

## 2024-01-30 ENCOUNTER — Other Ambulatory Visit (HOSPITAL_COMMUNITY)

## 2024-01-30 NOTE — Progress Notes (Addendum)
  [facility] Intensive Outpatient Program Discharge Summary  Donna Wagner [id number]  Admission date: 01/14/2024 Discharge date: 01/28/2024  Reason for admission: Per admission assessment note: " he had been referred due to informing counselor of worsening anxiety as well as worsening depressive symptoms to the point of writing a suicide note approximately 1.5 weeks prior to seeing counselor. She also struggles with significant anger which she feels it is uncontrolled at times. She reports regularly self harming herself approximately once a week where she cuts either her arm or leg with a sharp object or will at least pick at the scab that is healing from her last SIB. She reports having sporadic thoughts of SIB but feels it becomes unbearable and will cut herself. She reports struggling with SI significant less frequently and thoughts of cutting. She feels that her behaviors are often very impulsive when he came to cutting or writing the note. She denies current HI/AVH. She endorses passive SI and is able to contract for safety. She explains how she continued to struggle with relationship with boyfriend and ex best friend due to poor communication which resulted in her hospitalization last October. "  Progress in Program Toward Treatment Goals: Progressing was reported by case management that patient attended and participated in daily group session with active and engaged participation.  No concerns at discharge as this provider did not see patient on discharge date.  Patient had follow-up outpatient appointment scheduled.  Recently completed partial hospitalization programming.   Progress (rationale): Take all of you medications as prescribed by your mental healthcare provider.  Report any adverse effects and reactions from your medications to your outpatient provider promptly.  Do not engage in alcohol and or illegal drug use while on prescription medicines. Keep all scheduled  appointments. This is to ensure that you are getting refills on time and to avoid any interruption in your medication.  If you are unable to keep an appointment call to reschedule.  Be sure to follow up with resources and follow ups given. In the event of worsening symptoms call the crisis hotline, 911, and or go to the nearest emergency department for appropriate evaluation and treatment of symptoms. Follow-up with your primary care provider for your medical issues, concerns and or health care needs.    Collaboration of Care: Medication Management AEB continue medications as directed  Patient/Guardian was advised Release of Information must be obtained prior to any record release in order to collaborate their care with an outside provider. Patient/Guardian was advised if they have not already done so to contact the registration department to sign all necessary forms in order for [place] to release information regarding their care.   Consent: Patient/Guardian gives verbal consent for treatment and assignment of benefits for services provided during this visit. Patient/Guardian expressed understanding and agreed to proceed.   Oneta Rack, NP 01/30/2024

## 2024-02-02 ENCOUNTER — Other Ambulatory Visit (HOSPITAL_COMMUNITY)

## 2024-02-03 ENCOUNTER — Other Ambulatory Visit (HOSPITAL_COMMUNITY)

## 2024-02-04 ENCOUNTER — Other Ambulatory Visit (HOSPITAL_COMMUNITY)

## 2024-02-05 ENCOUNTER — Other Ambulatory Visit (HOSPITAL_COMMUNITY)

## 2024-02-06 ENCOUNTER — Other Ambulatory Visit (HOSPITAL_COMMUNITY)

## 2024-02-09 ENCOUNTER — Other Ambulatory Visit (HOSPITAL_COMMUNITY)

## 2024-02-10 ENCOUNTER — Other Ambulatory Visit (HOSPITAL_COMMUNITY)

## 2024-02-11 ENCOUNTER — Other Ambulatory Visit (HOSPITAL_COMMUNITY)

## 2024-02-12 ENCOUNTER — Other Ambulatory Visit (HOSPITAL_COMMUNITY)

## 2024-02-13 ENCOUNTER — Other Ambulatory Visit (HOSPITAL_COMMUNITY)

## 2024-02-17 ENCOUNTER — Other Ambulatory Visit (HOSPITAL_COMMUNITY): Payer: Self-pay | Admitting: Family

## 2024-02-17 DIAGNOSIS — F332 Major depressive disorder, recurrent severe without psychotic features: Secondary | ICD-10-CM

## 2024-02-17 DIAGNOSIS — F419 Anxiety disorder, unspecified: Secondary | ICD-10-CM

## 2024-02-18 DIAGNOSIS — F419 Anxiety disorder, unspecified: Secondary | ICD-10-CM

## 2024-02-18 DIAGNOSIS — F332 Major depressive disorder, recurrent severe without psychotic features: Secondary | ICD-10-CM

## 2024-02-19 ENCOUNTER — Telehealth: Payer: Self-pay

## 2024-02-19 ENCOUNTER — Ambulatory Visit: Payer: Self-pay | Admitting: Nurse Practitioner

## 2024-02-19 MED ORDER — GABAPENTIN 100 MG PO CAPS: 100.0000 mg | ORAL_CAPSULE | Freq: Two times a day (BID) | ORAL | 0 refills | Status: DC

## 2024-02-19 MED ORDER — LAMOTRIGINE 25 MG PO TABS: 50.0000 mg | ORAL_TABLET | Freq: Every day | ORAL | 0 refills | Status: DC

## 2024-02-19 NOTE — Telephone Encounter (Signed)
 pt left message that she needs refills on the lamictal  and the gabapentin . pt was last seen on 1-7 next appt 5-13

## 2024-02-19 NOTE — Telephone Encounter (Signed)
 Pt.notified

## 2024-02-19 NOTE — Telephone Encounter (Signed)
 Both medications requested has been sent to pharmacy and  a my chart message was sent to patient.

## 2024-02-23 ENCOUNTER — Ambulatory Visit (INDEPENDENT_AMBULATORY_CARE_PROVIDER_SITE_OTHER): Admitting: Licensed Clinical Social Worker

## 2024-02-23 DIAGNOSIS — Z8659 Personal history of other mental and behavioral disorders: Secondary | ICD-10-CM | POA: Diagnosis not present

## 2024-02-23 DIAGNOSIS — F331 Major depressive disorder, recurrent, moderate: Secondary | ICD-10-CM | POA: Diagnosis not present

## 2024-02-23 NOTE — Progress Notes (Signed)
 THERAPIST PROGRESS NOTE  Virtual Visit via Video Note  I connected with Donna Wagner on 02/23/24 at 10:00 AM EDT by a video enabled telemedicine application and verified that I am speaking with the correct person using two identifiers.  Location: Patient: Address on file  Provider: [facility]   I discussed the limitations of evaluation and management by telemedicine and the availability of in person appointments. The patient expressed understanding and agreed to proceed.    I discussed the assessment and treatment plan with the patient. The patient was provided an opportunity to ask questions and all were answered. The patient agreed with the plan and demonstrated an understanding of the instructions.   The patient was advised to call back or seek an in-person evaluation if the symptoms worsen or if the condition fails to improve as anticipated.  I provided 36 minutes of non-face-to-face time during this encounter.   Donna Slot, LCSW   Session Time: 10:02-10:38am  Participation Level: Active  Behavioral Response: CasualAlertEuthymic  Type of Therapy: Individual Therapy  Treatment Goals addressed:  Template: Anxiety         Problem: Anxiety     Dates: Start:  02/23/24       Disciplines: Interdisciplinary, PROVIDER        Goal: LTG: Donna Wagner will score less than 5 on the Generalized Anxiety Disorder 7 Scale (GAD-7)      Dates: Start:  02/23/24    Expected End:  05/25/24       Disciplines: Interdisciplinary, PROVIDER         Outcomes     Date/Time User Outcome    02/23/24 1023 Donna Wagner P Initial                  Goal: STG: Donna Wagner will reduce frequency of avoidant behaviors by 50% as evidenced by self-report in therapy sessions     Dates: Start:  02/23/24    Expected End:  05/25/24       Disciplines: Interdisciplinary, PROVIDER         Outcomes     Date/Time User Outcome    02/23/24 1023 Donna Wagner P Initial                  Goal: LTG: "find ways to  manage my anxiety by doing some things (e.g. driving for more than 15 minutes and going out alone) that make me slightly uncomfortable"      Dates: Start:  02/23/24    Expected End:  05/25/24       Disciplines: Interdisciplinary, PROVIDER         Outcomes     Date/Time User Outcome    02/23/24 1026 Donna Wagner P Initial                  Intervention: Work with patient individually to identify the major components of a recent episode of anxiety: physical symptoms, major thoughts and images, and major behaviors they experienced     Dates: Start:  02/23/24                Intervention: Coping Skills      Dates: Start:  02/23/24       Description: Will work with the pt using CBT/DBT techniques to help the pt verbalize an understanding of the cognitive, physiological, and behavioral components of anxiety and its treatment. This will be done by using worksheets, interactive activities, CBT/ABC thought logs, modeling, homework, role playing and journaling. Will work with pt to  learn and implement coping skills that result in a reduction of anxiety and improve daily functioning per pt self-report 3 out of 5 documented sessions.                   Template: Bipolar Disorder - Mania/Hypomania         Problem: BH CCP BIPOLAR DISORDER-MANIA/HYPOMANIA     Dates: Start:  10/30/23       Disciplines: Interdisciplinary, PROVIDER        Goal: LTG: Donna Wagner will stabilize mood and increase goal-directed behavior as measured by self report (Resolved)     Dates: Start:  10/30/23    Expected End:  04/28/24    Resolved:  02/23/24    Disciplines: Interdisciplinary, PROVIDER         Outcomes     Date/Time User Outcome    02/23/24 1017 Donna Wagner P Completed/Met            Goal note from Counselor 02/23/2024 by Donna Wagner B, LCSW     02/23/24: Patient reports she has been doing well and attributes well being to medications. Identifies when angry she is able to calm herself.                      Goal: STG: Donna Wagner will attend at least 80% of scheduled follow-up medication management appointments (Resolved)     Dates: Start:  10/30/23    Expected End:  04/28/24    Resolved:  02/23/24    Disciplines: Interdisciplinary, PROVIDER         Outcomes     Date/Time User Outcome    02/23/24 1017 Donna Wagner P Completed/Met                  Goal: STG: "I want to find better coping skills for my outbursts."  (Resolved)     Dates: Start:  10/30/23    Expected End:  04/28/24    Resolved:  02/23/24    Disciplines: Interdisciplinary, PROVIDER         Outcomes     Date/Time User Outcome    02/23/24 1017 Donna Wagner P Completed/Met            Goal note from Counselor 02/23/2024 by Donna Wagner B, LCSW     02/23/24: Patient reports completion due to successful use of coping skills.                                        Template: Depression (OP)         Problem: OP Depression     Dates: Start:  02/23/24       Disciplines: Interdisciplinary, PROVIDER        Goal: LTG: Reduce frequency, intensity, and duration of depression symptoms so that daily functioning is improved     Dates: Start:  02/23/24    Expected End:  05/25/24       Disciplines: Interdisciplinary, PROVIDER         Outcomes     Date/Time User Outcome    02/23/24 1026 Donna Wagner P Initial                  Goal: LTG: Increase coping skills to manage depression and improve ability to perform daily activities     Dates: Start:  02/23/24    Expected End:  05/25/24       Disciplines:  Interdisciplinary, PROVIDER         Outcomes     Date/Time User Outcome    02/23/24 1026 Donna Wagner P Initial                  Goal: STG: Donna Wagner will identify cognitive patterns and beliefs that support depression     Dates: Start:  02/23/24    Expected End:  05/25/24       Disciplines: Interdisciplinary, PROVIDER         Outcomes     Date/Time User Outcome    02/23/24 1026 Donna Wagner P Initial                                Template: PTSD (OP)         Problem: BH CCP Acute or Chronic Trauma Reaction     Dates: Start:  10/30/23       Disciplines: Interdisciplinary, Counselor, PROVIDER        Goal: LTG: Elimination of maladaptive behaviors and thinking patterns which interfere with resolution of trauma as evidenced by self report (Resolved)     Dates: Start:  10/30/23    Expected End:  04/28/24    Resolved:  02/23/24    Disciplines: Interdisciplinary, PROVIDER         Outcomes     Date/Time User Outcome    02/23/24 1026 Caedmon Louque P Completed/Met            Goal note from Counselor 02/23/2024 by Donna Wagner B, LCSW     02/23/24: 75% progress; Patient reports she is able to tie things back to an event to explain why she feels what she does.                     Goal: LTG: Recall traumatic events without becoming overwhelmed with negative emotions (Resolved)     Dates: Start:  10/30/23    Expected End:  04/28/24    Resolved:  02/23/24    Disciplines: Interdisciplinary, PROVIDER         Outcomes     Date/Time User Outcome    02/23/24 1026 Kennard Fildes P Completed/Met            Goal note from Counselor 02/23/2024 by Donna Wagner B, LCSW     02/23/24: 30% progress                    Goal: LTG: "I want to come to peace with my past and my trauma."  (Resolved)     Dates: Start:  10/30/23    Expected End:  04/28/24    Resolved:  02/23/24    Disciplines: Interdisciplinary, PROVIDER         Outcomes     Date/Time User Outcome    02/23/24 1026 Jaesean Litzau P Completed/Met            Goal note from Counselor 02/23/2024 by Donna Wagner B, LCSW     02/23/24: Patient reports 60% progress stating "I think I've made quiet a bit of progress by myself. I don't think about it as much as I used to."                     Goal: STG: Talyah will verbalize an increased sense of mastery over PTSD symptoms by using several techniques to cope with flashbacks, decrease the power of triggers, and decrease  negative thinking (Resolved)  Dates: Start:  10/30/23    Expected End:  04/28/24    Resolved:  02/23/24    Disciplines: Interdisciplinary, PROVIDER         Outcomes     Date/Time User Outcome    02/23/24 1026 Blandon Offerdahl P Completed/Met            Goal note from Counselor 02/23/2024 by Donna Wagner B, LCSW     02/23/24: 80% progress; "I've been putting myself in positions where I've been doing things that make me uncomfortable to overcome them like driving and going to the gym."                                     ProgressTowards Goals: Progressing  Interventions: Strength-based and Supportive  Summary: Donna Wagner is a 18 y.o. female who presents with symptoms of trauma, anxiety, depression. Patient identifies symptoms to include reexperiencing (nightmares), irritability, intrusive thoughts, uncontrollable worry, negative self affect, low mood. Pt was oriented times 5. Pt was cooperative and engaged. Pt reports SI but denies intent or plan at this time. Denies HI/AVH.   Patient reflected on her recent experiences in the PHP and IOP groups. She considered the coping skills discussed in the sessions and reflected on positive distraction techniques. Shared some new hobbies she has been engaging in.  Patient identified several life updates that have impacted her mental health, including her recent breakup. She mentioned that she is in the process of finding a job and feels confident in her ability to enter the workforce.   [name]. assessed for personal goals and she shared a desire to stop vaping, as she tends to vape when she is bored, which is a reason she is interested in getting a job.  Currently, she rates her depression and anxiety at a level 2. Identifiying limited structure and routine as a significant stressor. Patient celebrated the fact that she has been going to the gym, and continues to express a goal to go daily.  [name] readmistered PHQ9 and GAD7 screeners to  assess for depression and anxiety. Patients scored decressed from 11 to 7 on her PHQ9 and from 17 to 4 on the GAD7.   [name] and patient also reviewed progress through reviewing her treatment plan. See comments above. The patient reports she feels she has made a great deal of progress on her own procesing her trauma history and expressed a desire to remove those goals from her treatment plan. Instead, the patient expressed a desire to work towards emotional regaultion and problem solving.   Suicidal/Homicidal: Nowithout intent/plan Regarding suicidal ideation, she experienced thoughts about 4-5 days ago due to some negative comments on her YouTube video. However, she denies any intent or plan related to this episode. She shares she found that crocheting helped distract her from these thoughts. [name] reviewed safety plan should patient have intent or plan.    Therapist Response: Clinician utilized active and supportive reflection to create a safe environment for patient to process recent life stressors. Clinician assessed for current symptoms, safety, stressors since last session.  Reflected on patient's time in PHP and IOP group.  Reviewed coping skills and lessons learned.  Addressed patient's progress through reviewing her treatment plan as well as readministering screeners.  Plan: Return again in 4 weeks. Patient was unable to be seen by clinician within the 4 week span, but reviewed coping skills and support system. Patient was also  added to a list to be called should the LCSW have a cancellation.   Diagnosis: MDD (major depressive disorder), recurrent episode, moderate (HCC)  Hx of bipolar disorder   Collaboration of Care: [facility] psychiatrist can access notes and cln. Will review psychiatrists' notes. Check in with the patient and will see LCSW per availability. Patient agreed with treatment recommendations.   Patient/Guardian was advised Release of Information must be obtained prior to any record  release in order to collaborate their care with an outside provider. Patient/Guardian was advised if they have not already done so to contact the registration department to sign all necessary forms in order for us  to release information regarding their care.   Consent: Patient/Guardian gives verbal consent for treatment and assignment of benefits for services provided during this visit. Patient/Guardian expressed understanding and agreed to proceed.   Donna Slot, LCSW 02/23/2024

## 2024-03-02 ENCOUNTER — Other Ambulatory Visit: Payer: Self-pay | Admitting: Psychiatry

## 2024-03-02 ENCOUNTER — Encounter: Payer: Self-pay | Admitting: Psychiatry

## 2024-03-02 ENCOUNTER — Ambulatory Visit (INDEPENDENT_AMBULATORY_CARE_PROVIDER_SITE_OTHER): Admitting: Psychiatry

## 2024-03-02 ENCOUNTER — Other Ambulatory Visit: Payer: Self-pay

## 2024-03-02 VITALS — BP 101/70 | HR 87 | Temp 97.3°F | Ht 64.0 in | Wt 138.2 lb

## 2024-03-02 DIAGNOSIS — F3342 Major depressive disorder, recurrent, in full remission: Secondary | ICD-10-CM | POA: Diagnosis not present

## 2024-03-02 DIAGNOSIS — F1290 Cannabis use, unspecified, uncomplicated: Secondary | ICD-10-CM

## 2024-03-02 DIAGNOSIS — Z8659 Personal history of other mental and behavioral disorders: Secondary | ICD-10-CM

## 2024-03-02 DIAGNOSIS — F4310 Post-traumatic stress disorder, unspecified: Secondary | ICD-10-CM

## 2024-03-02 DIAGNOSIS — F3341 Major depressive disorder, recurrent, in partial remission: Secondary | ICD-10-CM

## 2024-03-02 DIAGNOSIS — F4010 Social phobia, unspecified: Secondary | ICD-10-CM

## 2024-03-02 DIAGNOSIS — Z79899 Other long term (current) drug therapy: Secondary | ICD-10-CM

## 2024-03-02 NOTE — Progress Notes (Signed)
 BH MD OP Progress Note  03/02/2024 10:58 AM Donna Wagner  MRN:  [id number]  Chief Complaint:  Chief Complaint  Patient presents with   Follow-up   Anxiety   Depression   Medication Refill   Discussed the use of AI scribe software for clinical note transcription with the patient, who gave verbal consent to proceed.  History of Present Illness Donna Wagner is a 18 year old Caucasian female who is single, lives in [place], has a history of MDD, PTSD, comorbid use of cannabis, currently self employed, presents for follow-up after completing PHP and IOP programs with current health in [place].  She has completed both Partial Hospitalization Program (PHP) and Intensive Outpatient Program (IOP) as of April 9th. PHP was more beneficial for her rehabilitation compared to IOP, but both programs were helpful. Since completing these programs, she has not experienced any episodes of depression or anxiety, which she attributes to either the medications or the programs themselves.  She continues to see her therapist, Ms.Kristina Deann Exon, who has reduced the frequency of her sessions, indicating progress. Previously, she had passive suicidal thoughts before entering PHP, but she no longer experiences these thoughts and feels content with her current life situation.  She currently denies any suicidality, homicidality or perceptual disturbances.  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 She has reduced her cannabis use and no longer uses it daily.  Her current medications include aripiprazole  5 mg daily, escitalopram  20 mg daily, gabapentin  twice daily, and lamotrigine  25 mg, two tablets daily. She has trazodone  50 mg for sleep but does not use it as she is sleeping well without it. She has discontinued Depakote . She has been on birth control for three weeks, which may interact with lamotrigine , potentially requiring a dosage adjustment.  She reports occasional  nightmares but no other PTSD symptoms. No muscle spasms, rigidity, tremor, or stiffness from aripiprazole  use.   Denies any other concerns today.    Visit Diagnosis:    ICD-10-CM   1. MDD (major depressive disorder), recurrent, in full remission (HCC)  F33.42     2. PTSD (post-traumatic stress disorder)  F43.10     3. Social anxiety disorder  F40.10     4. Hx of bipolar disorder  Z86.59     5. Long term current use of cannabis  F12.90     6. High risk medication use  Z79.899       Past Psychiatric History: I have reviewed past psychiatric history from progress note on 10/28/2023.  Patient with history of 2 inpatient hospitalizations in the past.  Most recent 22 July 2023, [facility], [place].  Patient completed PHP and IOP program-January 28, 2024-[facility].  Past trials of medications like Zoloft , Depakote .  Past Medical History:  Past Medical History:  Diagnosis Date   Anxiety    Bipolar disorder (HCC)    Depression     Past Surgical History:  Procedure Laterality Date   APPENDECTOMY     XI ROBOTIC LAPAROSCOPIC ASSISTED APPENDECTOMY N/A 11/19/2022   Procedure: XI ROBOTIC LAPAROSCOPIC ASSISTED APPENDECTOMY;  Surgeon: Eldred Grego, MD;  Location: [facility] ORS;  Service: General;  Laterality: N/A;    Family Psychiatric History: I have reviewed family psychiatric history from progress note on 10/28/2023.  Family History:  Family History  Problem Relation Age of Onset   ADD / ADHD Mother    Hypertension Mother    Depression Mother    Mental illness Mother  Mental illness Father    Mental illness Sister    Depression Sister    Cancer Maternal Grandfather    Mental illness Maternal Grandmother    Depression Maternal Grandmother     Social History: I have reviewed social history from progress note on 10/28/2023. Social History   Socioeconomic History   Marital status: Single    Spouse name: Not on file   Number of children: 0   Years of education: Not  on file   Highest education level: High school graduate  Occupational History   Not on file  Tobacco Use   Smoking status: Never    Passive exposure: Never   Smokeless tobacco: Never  Vaping Use   Vaping status: Every Day   Substances: THC  Substance and Sexual Activity   Alcohol use: Not Currently   Drug use: Yes    Frequency: 7.0 times per week    Types: Marijuana    Comment: smokes every other day   Sexual activity: Yes    Partners: Male    Birth control/protection: Condom  Other Topics Concern   Not on file  Social History Narrative   Not on file   Social Drivers of Health   Financial Resource Strain: Low Risk  (02/03/2024)   Received [facility]   Overall Financial Resource Strain (CARDIA)    Difficulty of Paying Living Expenses: Not hard at all  Food Insecurity: No Food Insecurity (02/03/2024)   Received from [facility]   Hunger Vital Sign    Worried About Running Out of Food in the Last Year: Never true    Ran Out of Food in the Last Year: Never true  Transportation Needs: No Transportation Needs (02/03/2024)   Received from [facility]    In the past 12 months, has lack of transportation kept you from medical appointments or from getting medications?: No    Lack of Transportation (Non-Medical): No  Physical Activity: Not on file  Stress: Not on file  Social Connections: Not on file    Allergies:  Allergies  Allergen Reactions   Zoloft  [Sertraline ] Other (See Comments)    Does not work for pt    Metabolic Disorder Labs: Lab Results  Component Value Date   HGBA1C 5.2 08/07/2023   MPG 102.54 08/07/2023   MPG 96.8 12/20/2020   Lab Results  Component Value Date   PROLACTIN 29.1 (H) 12/20/2020   Lab Results  Component Value Date   CHOL 142 05/22/2023   TRIG 43.0 05/22/2023   HDL 45.30 05/22/2023   CHOLHDL 3 05/22/2023   VLDL 8.6 05/22/2023   LDLCALC 88  05/22/2023   LDLCALC 82 12/20/2020   Lab Results  Component Value Date   TSH 1.90 05/22/2023   TSH 1.185 12/20/2020    Therapeutic Level Labs: No results found for: "LITHIUM" Lab Results  Component Value Date   VALPROATE 67 08/08/2023   No results found for: "CBMZ"  Current Medications: Current Outpatient Medications  Medication Sig Dispense Refill   escitalopram  (LEXAPRO ) 20 MG tablet TAKE 1 TABLET(20 MG) BY MOUTH DAILY 90 tablet 0   gabapentin  (NEURONTIN ) 100 MG capsule Take 1 capsule (100 mg total) by mouth 2 (two) times daily. 60 capsule 0   lamoTRIgine  (LAMICTAL ) 25 MG tablet Take 2 tablets (50 mg total) by mouth daily. 60 tablet 0   melatonin 5 MG TABS Take 1 tablet (5 mg total) by mouth at bedtime  as needed. 30 tablet 0   Multiple Vitamins-Iron  (MULTIVITAMINS WITH IRON ) TABS tablet Take 1 tablet by mouth daily. 30 tablet 0   norethindrone-ethinyl estradiol -FE (LOESTRIN FE) 1-20 MG-MCG tablet Take 1 tablet by mouth daily.     traZODone  (DESYREL ) 50 MG tablet Take 0.5-1 tablets (25-50 mg total) by mouth at bedtime as needed for sleep. 30 tablet 0   Vitamin D , Ergocalciferol , (DRISDOL ) 1.25 MG (50000 UNIT) CAPS capsule Take 1 capsule (50,000 Units total) by mouth every 7 (seven) days. 5 capsule 0   ARIPiprazole  (ABILIFY ) 5 MG tablet TAKE 1 TABLET(5 MG) BY MOUTH DAILY 30 tablet 2   No current facility-administered medications for this visit.     Musculoskeletal: Strength & Muscle Tone: within normal limits Gait & Station: normal Patient leans: N/A  Psychiatric Specialty Exam: Review of Systems  Psychiatric/Behavioral: Negative.      Blood pressure 101/70, pulse 87, temperature (!) 97.3 F (36.3 C), temperature source Temporal, height 5\' 4"  (1.626 m), weight 138 lb 3.2 oz (62.7 kg).Body mass index is 23.72 kg/m.  General Appearance: Casual  Eye Contact:  Fair  Speech:  Normal Rate  Volume:  Normal  Mood:  Euthymic  Affect:  Appropriate  Thought Process:  Goal  Directed and Descriptions of Associations: Intact  Orientation:  Full (Time, Place, and Person)  Thought Content: Logical   Suicidal Thoughts:  No  Homicidal Thoughts:  No  Memory:  Immediate;   Fair Recent;   Fair Remote;   Fair  Judgement:  Fair  Insight:  Fair  Psychomotor Activity:  Normal  Concentration:  Concentration: Fair and Attention Span: Fair  Recall:  [employer] of Knowledge: Fair  Language: Fair  Akathisia:  No  Handed:  Right  AIMS (if indicated): not done  Assets:  Desire for Improvement Housing Social Support Transportation  ADL's:  Intact  Cognition: WNL  Sleep:  Fair   Screenings: AIMS    Flowsheet Row Office Visit from 10/28/2023 in [facility] Ad[facility] (Dis[facility]) [facility]  AIMS Total Score 0 0      AUDIT    Flowsheet Row Admission (Discharged) [facility]  Alcohol Use Disorder Identification Test Final Score (AUDIT) 0      GAD-7    Flowsheet Row Office Visit from 03/02/2024 in [facility] Counselor from 02/23/2024 in [facility] Office Visit from 10/28/2023 in [facility] Office Visit from 08/22/2023 in [facility] [employer] Visit from 05/15/2023 in [facility] [employer]  Total GAD-7 Score 2 4 17 19 11       [employer]    Flowsheet Row Office Visit from 03/02/2024 in [facility] Counselor from 02/23/2024 in [facility] Counselor from 12/31/2023 in [facility] Counselor from 12/15/2023 in [facility] Counselor from 12/10/2023 in [facility]  PHQ-2 Total Score 1 1 3 3 2   PHQ-9 Total Score -- 7 10 14 10       Flowsheet Row Office Visit from 03/02/2024 in [facility] Counselor from 02/23/2024 in [facility] Counselor from 12/31/2023 in [facility]  C-SSRS RISK CATEGORY Moderate Risk Error: Q3, 4, or 5 should  not be populated when Q2 is No High Risk        Assessment and Plan: Donna Wagner is a 18 year old Caucasian female who presents for follow-up appointment after completing PHP/IOP-discussed assessment and plan as noted below.  MDD in remission Currently depression as well managed.  He recently completed PHP/IOP and is currently doing well.  Compliant on medications as prescribed.  Eager to continue psychotherapy sessions. - Continue Abilify  5 mg daily - Continue Lexapro  20 mg daily - Continue gabapentin  100 mg twice a day. - Continue Lamictal  50 mg daily, discussed drug to drug interaction with estrogen oral contraception.  Patient may need a higher dosage of Lamictal  , however currently declines any dosage increase need.   PTSD-improving Currently reports having intermittent nightmares overall otherwise doing fairly well.  Eager to continue psychotherapy sessions. - Continue Lexapro  20 mg daily - Continue Trazodone  25 to 50 mg at bedtime as needed - Continue CBT with Ms. Perkins  Social anxiety disorder-improving Currently reports anxiety symptoms are improving on the current medication regimen and therapy. - Continue psychotherapy sessions - Continue Gabapentin  100 mg twice a day - Continue Lexapro  as prescribed  Long-term use of cannabis-improving She is currently trying to cut back. - Encouraged to continue to wean off.  High risk medication use-patient to complete lipid panel and prolactin level, labs ordered to [facility] lab.  Rule out ADHD-pending records from [facility] neuropsychiatry.   Communicated with staff to request it again.  Follow-up Follow-up in clinic in 2 months or sooner if needed.   Collaboration of Care: Collaboration of Care: Referral or follow-up with counselor/therapist AEB patient and patient to continue CBT, I have reviewed notes from PHP/IOP-Mr. Verlyn Goad NP, Dr.Ji -patient with MDD, PTSD versus borderline personality disorder discharged from [facility] on 01/28/2024 after being stabilized.   Patient/Guardian was advised Release of Information must be obtained prior to any record release in order to collaborate their care with an outside provider. Patient/Guardian was advised if they have not already done so to contact the registration department to sign all necessary forms in order for us  to release information regarding their care.   Consent: Patient/Guardian gives verbal consent for treatment and assignment of benefits for services provided during this visit. Patient/Guardian expressed understanding and agreed to proceed.   This note was generated in part or whole with voice recognition software. Voice recognition is usually quite accurate but there are transcription errors that can and very often do occur. I apologize for any typographical errors that were not detected and corrected.    Jernard Reiber, MD 03/02/2024, 10:58 AM

## 2024-03-10 ENCOUNTER — Ambulatory Visit (INDEPENDENT_AMBULATORY_CARE_PROVIDER_SITE_OTHER): Admitting: Licensed Clinical Social Worker

## 2024-03-10 DIAGNOSIS — F4310 Post-traumatic stress disorder, unspecified: Secondary | ICD-10-CM | POA: Diagnosis not present

## 2024-03-10 DIAGNOSIS — Z8659 Personal history of other mental and behavioral disorders: Secondary | ICD-10-CM

## 2024-03-10 DIAGNOSIS — F3342 Major depressive disorder, recurrent, in full remission: Secondary | ICD-10-CM | POA: Diagnosis not present

## 2024-03-10 DIAGNOSIS — F4010 Social phobia, unspecified: Secondary | ICD-10-CM | POA: Diagnosis not present

## 2024-03-10 NOTE — Progress Notes (Signed)
 THERAPIST PROGRESS NOTE  Virtual Visit via Video Note  I connected with Donna Wagner on 03/10/24 at  2:00 PM EDT by a video enabled telemedicine application and verified that I am speaking with the correct person using two identifiers.  Location: Patient: Address on file  Provider: [facility]   I discussed the limitations of evaluation and management by telemedicine and the availability of in person appointments. The patient expressed understanding and agreed to proceed.   I discussed the assessment and treatment plan with the patient. The patient was provided an opportunity to ask questions and all were answered. The patient agreed with the plan and demonstrated an understanding of the instructions.   The patient was advised to call back or seek an in-person evaluation if the symptoms worsen or if the condition fails to improve as anticipated.  I provided 54 minutes of non-face-to-face time during this encounter.   Marvin Slot, LCSW   Session Time: 2-2:54pm  Participation Level: Active  Behavioral Response: DisheveledAlertEuthymic  Type of Therapy: Individual Therapy  Treatment Goals addressed: Goal: LTG: Kabao will score less than 5 on the Generalized Anxiety Disorder 7 Scale (GAD-7)     Goal: STG: Jayleigh will reduce frequency of avoidant behaviors by 50% as evidenced by self-report in therapy sessions        Goal: LTG: "find ways to manage my anxiety by doing some things (e.g. driving for more than 15 minutes and going out alone) that make me slightly uncomfortable"        Template: Depression (OP)         Goal: LTG: Reduce frequency, intensity, and duration of depression symptoms so that daily functioning is improved   Goal: LTG: Increase coping skills to manage depression and improve ability to perform daily activities      Goal: STG: Toshiye will identify cognitive patterns and beliefs that support depression    ProgressTowards Goals: Progressing  Interventions:  DBT, Solution Focused, and Supportive  Summary: Donna Wagner is a 18 y.o. female who presents with symptoms of trauma, anxiety, depression. Patient identifies symptoms to include reexperiencing (nightmares), irritability, intrusive thoughts, uncontrollable worry, negative self affect, low mood. Pt was oriented times 5. Pt was cooperative and engaged. Pt reports SI but denies intent or plan at this time. Denies HI/AVH.    Patient reflected on progress identifying she has been socializing more, getting out and crocheting more, and pet siting. Reports she has to set time to remember to engage in hygiene. Shares she has been going to the gym more with a friend.   Patient reflected on recent distressing incident that resulted in self harming episode as a result of pet sitting.  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  Clinician utilize DBT chain analysis of problem behavior activity to work with patient on reflecting on triggers resulting in her problem behavior.  Patient identified triggering thoughts to include "nobody cares about me/cares to come help me."  Patient reflected on feelings of trapped, annoyed, boredom, and distressed as a result of pet sitting in a new environment by herself.  She identified vulnerable factors from outstanding situations that resulted in interference with her ability to cope.  Clinician and patient worked together to identify actions, body sensations, cognitions, events, and feelings that resulted in her self harming episode.  Clinician and patient then worked together to establish more skillful behaviors to replace her ineffective behaviors.  Clinician provided psychoeducation on TIP and  efforts to change her bodily response when emotionally overwhelmed.  Patient and clinician reviewed prevention strategy for solving the prompting event should it happen again with the patient establishing importance of setting up her new environment  with similar things, set up a call with her support system, taking a moment to settle in before bringing out the pets, immediately taking the pets outside, do a physical exercise, and refusing to bring sharp objects while pet sitting with her to limit access to means.  Patient identified she felt grateful for the opportunity to reflect on this event and hopeful that she will be able to cope should the situation arise again.  Suicidal/Homicidal: Nowithout intent/plan  Therapist Response: Clinician utilized active and supportive reflection to create a safe environment for patient to process recent life stressors. Clinician assessed for current symptoms, safety, stressors since last session.  Clinician utilized DBT chain analysis of problem behavior exercise to reflect on alternative behaviors to replace self harming behaviors.  Plan: Return again in 2 weeks.  Diagnosis: MDD (major depressive disorder), recurrent, in full remission (HCC)  PTSD (post-traumatic stress disorder)  Social anxiety disorder  Hx of bipolar disorder   Collaboration of Care: AEB psychiatrist can access notes and cln. Will review psychiatrists' notes. Check in with the patient and will see LCSW per availability. Patient agreed with treatment recommendations.   Patient/Guardian was advised Release of Information must be obtained prior to any record release in order to collaborate their care with an outside provider. Patient/Guardian was advised if they have not already done so to contact the registration department to sign all necessary forms in order for us  to release information regarding their care.   Consent: Patient/Guardian gives verbal consent for treatment and assignment of benefits for services provided during this visit. Patient/Guardian expressed understanding and agreed to proceed.   Marvin Slot, LCSW 03/10/2024

## 2024-03-10 NOTE — Addendum Note (Signed)
 Addended by: Almetta Jacquet B on: 03/10/2024 04:46 PM   Modules accepted: Level of Service

## 2024-03-19 ENCOUNTER — Other Ambulatory Visit: Payer: Self-pay | Admitting: Psychiatry

## 2024-03-19 DIAGNOSIS — F332 Major depressive disorder, recurrent severe without psychotic features: Secondary | ICD-10-CM

## 2024-03-19 DIAGNOSIS — F419 Anxiety disorder, unspecified: Secondary | ICD-10-CM

## 2024-03-22 ENCOUNTER — Telehealth: Payer: Self-pay

## 2024-03-22 NOTE — Telephone Encounter (Signed)
 pt left a message that t is a drug to drug interaction with her birth control and the lamictal . pt was last seen on 5-13 next appt 7-3

## 2024-03-22 NOTE — Telephone Encounter (Signed)
 Yes , this was already discussed at her last visit and its in the progress notes from that day , she may be able to go back and read it.  A lamotrigine  maintenance dose increase of up to 2-fold may be required during concomitant use of estrogen hormones.We can discuss dose change of Lamotrigine  as needed at her next visit .  However if she needs a sooner appointment please let her know to schedule.

## 2024-03-23 NOTE — Telephone Encounter (Signed)
Thank you for scheduling this patient.

## 2024-03-23 NOTE — Telephone Encounter (Signed)
 Please contact this patient and schedule a follow up appointment , virtual or in person in the next week or so for medication changes , first available.

## 2024-03-23 NOTE — Telephone Encounter (Signed)
 pt called wanted to know if she can go ahead and have a increase or change medication before appt

## 2024-03-25 ENCOUNTER — Encounter: Payer: Self-pay | Admitting: Licensed Clinical Social Worker

## 2024-03-31 NOTE — Telephone Encounter (Signed)
 Left message to return call to our office.  To see if pt needs medication or if the pharmacy just sent a refill request.

## 2024-04-01 ENCOUNTER — Telehealth (INDEPENDENT_AMBULATORY_CARE_PROVIDER_SITE_OTHER): Admitting: Psychiatry

## 2024-04-01 ENCOUNTER — Encounter: Payer: Self-pay | Admitting: Licensed Clinical Social Worker

## 2024-04-01 DIAGNOSIS — F4010 Social phobia, unspecified: Secondary | ICD-10-CM | POA: Diagnosis not present

## 2024-04-01 DIAGNOSIS — F4310 Post-traumatic stress disorder, unspecified: Secondary | ICD-10-CM | POA: Diagnosis not present

## 2024-04-01 DIAGNOSIS — F1290 Cannabis use, unspecified, uncomplicated: Secondary | ICD-10-CM

## 2024-04-01 DIAGNOSIS — F3342 Major depressive disorder, recurrent, in full remission: Secondary | ICD-10-CM | POA: Diagnosis not present

## 2024-04-01 DIAGNOSIS — Z8659 Personal history of other mental and behavioral disorders: Secondary | ICD-10-CM

## 2024-04-01 NOTE — Progress Notes (Signed)
 Virtual Visit via Video Note  I connected with Donna Wagner on 04/01/24 at  3:30 PM EDT by a video enabled telemedicine application and verified that I am speaking with the correct person using two identifiers. Location Provider Location : [facility] Patient Location : Home  Participants: Patient , Provider    I discussed the limitations of evaluation and management by telemedicine and the availability of in person appointments. The patient expressed understanding and agreed to proceed.   I discussed the assessment and treatment plan with the patient. The patient was provided an opportunity to ask questions and all were answered. The patient agreed with the plan and demonstrated an understanding of the instructions.   The patient was advised to call back or seek an in-person evaluation if the symptoms worsen or if the condition fails to improve as anticipated.    [facility] MD OP Progress Note  04/02/2024 12:04 PM Donna Wagner  MRN:  [id number]  Chief Complaint:  Chief Complaint  Patient presents with   Follow-up   Depression   Anxiety   Medication Refill   HPI: Donna Wagner is a 18 year old Caucasian female who is single, lives in [place], has a history of MDD, PTSD, comorbid use of cannabis, currently self-employed, was evaluated by telemedicine today.  Currently reports mood symptoms as overall good.  3 weeks ago when she came off of her birth control for a week she felt extremely agitated and irritable.  She however reports that resolved once she started taking her hormonal pills again.  She is currently on day 2 of her pill free period for this month and so far has not had any significant mood swings or irritability.  She wonders about her estrogen interacting with her lamotrigine .  She is otherwise compliant on all her medications including Abilify , Lexapro , trazodone .  Denies side effects.  She reports sleep is overall good.  She denies any appetite changes.  She  continues to be engaged in activities like crocheting and pet sitting.  She also has a friend that she spends time with.  She denies any suicidality, homicidality or perceptual disturbances.  She is currently engaged in psychotherapy with Ms. Donna Wagner and that is going well.  Visit Diagnosis:    ICD-10-CM   1. MDD (major depressive disorder), recurrent, in full remission (HCC)  F33.42     2. PTSD (post-traumatic stress disorder)  F43.10     3. Social anxiety disorder  F40.10     4. Hx of bipolar disorder  Z86.59     5. Long term current use of cannabis  F12.90       Past Psychiatric History: I have reviewed past psychiatric history from progress note on 10/28/2023.  Past trials of medications like Zoloft , Depakote .  Past Medical History:  Past Medical History:  Diagnosis Date   Anxiety    Bipolar disorder (HCC)    Depression     Past Surgical History:  Procedure Laterality Date   APPENDECTOMY     XI ROBOTIC LAPAROSCOPIC ASSISTED APPENDECTOMY N/A 11/19/2022   Procedure: XI ROBOTIC LAPAROSCOPIC ASSISTED APPENDECTOMY;  Surgeon: Donna Grego, MD;  Location: [facility] ORS;  Service: General;  Laterality: N/A;    Family Psychiatric History: I have reviewed family psychiatric history from progress note on 10/28/2023.  Family History:  Family History  Problem Relation Age of Onset   ADD / ADHD Mother    Hypertension Mother    Depression Mother    Mental illness Mother  Mental illness Father    Mental illness Sister    Depression Sister    Cancer Maternal Grandfather    Mental illness Maternal Grandmother    Depression Maternal Grandmother     Social History: I have reviewed social history from progress note on 10/28/2023. Social History   Socioeconomic History   Marital status: Single    Spouse name: Not on file   Number of children: 0   Years of education: Not on file   Highest education level: High school graduate  Occupational History   Not on file  Tobacco  Use   Smoking status: Never    Passive exposure: Never   Smokeless tobacco: Never  Vaping Use   Vaping status: Every Day   Substances: THC  Substance and Sexual Activity   Alcohol use: Not Currently   Drug use: Yes    Frequency: 7.0 times per week    Types: Marijuana    Comment: smokes every other day   Sexual activity: Yes    Partners: Male    Birth control/protection: Condom  Other Topics Concern   Not on file  Social History Narrative   Not on file   Social Drivers of Health   Financial Resource Strain: Low Risk  (02/03/2024)   Received from [facility]   Overall Financial Resource Strain (CARDIA)    Difficulty of Paying Living Expenses: Not hard at all  Food Insecurity: No Food Insecurity (02/03/2024)   Received from [facility]   Hunger Vital Sign    Within the past 12 months, you worried that your food would run out before you got the money to buy more.: Never true    Within the past 12 months, the food you bought just didn't last and you didn't have money to get more.: Never true  Transportation Needs: No Transportation Needs (02/03/2024)   Received from [facility]    In the past 12 months, has lack of transportation kept you from medical appointments or from getting medications?: No    Lack of Transportation (Non-Medical): No  Physical Activity: Not on file  Stress: Not on file  Social Connections: Not on file    Allergies:  Allergies  Allergen Reactions   Zoloft  [Sertraline ] Other (See Comments)    Does not work for pt    Metabolic Disorder Labs: Lab Results  Component Value Date   HGBA1C 5.2 08/07/2023   MPG 102.54 08/07/2023   MPG 96.8 12/20/2020   Lab Results  Component Value Date   PROLACTIN 29.1 (H) 12/20/2020   Lab Results  Component Value Date   CHOL 142 05/22/2023   TRIG 43.0 05/22/2023   HDL 45.30 05/22/2023   CHOLHDL 3 05/22/2023   VLDL 8.6 05/22/2023    LDLCALC 88 05/22/2023   LDLCALC 82 12/20/2020   Lab Results  Component Value Date   TSH 1.90 05/22/2023   TSH 1.185 12/20/2020    Therapeutic Level Labs: No results found for: LITHIUM Lab Results  Component Value Date   VALPROATE 67 08/08/2023   No results found for: CBMZ  Current Medications: Current Outpatient Medications  Medication Sig Dispense Refill   ARIPiprazole  (ABILIFY ) 5 MG tablet TAKE 1 TABLET(5 MG) BY MOUTH DAILY 30 tablet 2   escitalopram  (LEXAPRO ) 20 MG tablet TAKE 1 TABLET(20 MG) BY MOUTH DAILY 90 tablet 0   gabapentin  (NEURONTIN ) 100 MG capsule TAKE 1 CAPSULE(100 MG) BY MOUTH TWICE DAILY 60 capsule  1   lamoTRIgine  (LAMICTAL ) 25 MG tablet TAKE 2 TABLETS(50 MG) BY MOUTH DAILY 60 tablet 1   melatonin 5 MG TABS Take 1 tablet (5 mg total) by mouth at bedtime as needed. 30 tablet 0   Multiple Vitamins-Iron  (MULTIVITAMINS WITH IRON ) TABS tablet Take 1 tablet by mouth daily. 30 tablet 0   norethindrone-ethinyl estradiol -FE (LOESTRIN FE) 1-20 MG-MCG tablet Take 1 tablet by mouth daily.     traZODone  (DESYREL ) 50 MG tablet Take 0.5-1 tablets (25-50 mg total) by mouth at bedtime as needed for sleep. 30 tablet 0   Vitamin D , Ergocalciferol , (DRISDOL ) 1.25 MG (50000 UNIT) CAPS capsule Take 1 capsule (50,000 Units total) by mouth every 7 (seven) days. 5 capsule 0   No current facility-administered medications for this visit.     Musculoskeletal: Strength & Muscle Tone: UTA Gait & Station: Seated Patient leans: N/A  Psychiatric Specialty Exam: Review of Systems  Psychiatric/Behavioral: Negative.      There were no vitals taken for this visit.There is no height or weight on file to calculate BMI.  General Appearance: Casual  Eye Contact:  Fair  Speech:  Clear and Coherent  Volume:  Normal  Mood:  Euthymic  Affect:  Congruent  Thought Process:  Goal Directed and Descriptions of Associations: Intact  Orientation:  Full (Time, Place, and Person)  Thought Content:  Logical   Suicidal Thoughts:  No  Homicidal Thoughts:  No  Memory:  Immediate;   Fair Recent;   Fair Remote;   Fair  Judgement:  Fair  Insight:  Fair  Psychomotor Activity:  Normal  Concentration:  Concentration: Fair and Attention Span: Fair  Recall:  [employer] of Knowledge: Fair  Language: Fair  Akathisia:  No  Handed:  Right  AIMS (if indicated): not done  Assets:  Communication Skills Desire for Improvement Housing Social Support  ADL's:  Intact  Cognition: WNL  Sleep:  Fair   Screenings: AIMS    Flowsheet Row Office Visit from 10/28/2023 in [facility] Ad[facility] (Discharged) [facility]  AIMS Total Score 0 0   AUDIT    Flowsheet Row Admission (Discharged) [facility]  Alcohol Use Disorder Identification Test Final Score (AUDIT) 0   GAD-7    Flowsheet Row Office Visit from 03/02/2024 in [facility] Counselor from 02/23/2024 in [facility] Office Visit from 10/28/2023 in [facility] Office Visit from 08/22/2023 in [facility] [employer] Visit from 05/15/2023 in [facility] [employer] [facility]  Total GAD-7 Score 2 4 17 19 11    PHQ2-9    Flowsheet Row Office Visit from 03/02/2024 in [facility] Counselor from 02/23/2024 in [facility] Counselor from 12/31/2023 in [facility] Counselor from 12/15/2023 in [facility] Counselor from 12/10/2023 in [facility]  PHQ-2 Total Score 1 1 3 3 2   PHQ-9 Total Score -- 7 10 14 10    Flowsheet Row Video Visit from 04/01/2024 in [facility] Office Visit from 03/02/2024 in [facility] Counselor from 02/23/2024 in [facility]  C-SSRS RISK CATEGORY Moderate Risk Moderate Risk Error: Q3, 4, or 5 should not be populated when  Q2 is No     Assessment and Plan: Donna Wagner is a 18 year old Caucasian female who has a history of depression, PTSD, anxiety was evaluated by telemedicine today.  Discussed assessment and plan as noted below.  MDD in remission Currently depression symptoms are managed on the current medication regimen.  Had episodic mood symptoms around the time of her last pill free period with estrogen.  She however is again on her pill free period since the past 2 days and has not noticed any worsening mood symptoms.  Provided instructions regarding interaction with Lamictal  and estrogen birth control, when taken together could reduce the Lamictal  level as well as could increase Lamictal  level in the system during pill free periods. Continue Abilify  5 mg daily Continue Lexapro  20 mg daily Continue Gabapentin  100 mg twice a day Continue Lamictal  50 mg daily for now. She will keep a log of her mood symptoms and let this provider know for further medication management as needed.  PTSD-stable Currently denies any significant PTSD symptoms. Continue psychotherapy sessions with Ms. Perkins Continue Lexapro  20 mg daily Continue Trazodone  25 to 50 mg at bedtime as needed  Social anxiety disorder-improving Currently reports anxiety symptoms as manageable. Continue Lexapro  as prescribed Continue Gabapentin  100 mg twice a day Continue psychotherapy sessions.  Long-term use of cannabis-improving Will reevaluate in future sessions.  Pending labs-lipid panel, prolactin level.  Follow-up Follow-up in clinic in 4 weeks or sooner if needed.  Collaboration of Care: Collaboration of Care: Referral or  follow-up with counselor/therapist AEB encouraged to continue psychotherapy sessions with Ms. Donna Wagner, have reviewed notes per Ms. Perkins dated 03/10/2024-patient currently engaged in DBT.  Patient/Guardian was advised Release of Information must be obtained prior to any record release in order to collaborate their care with an outside provider. Patient/Guardian was advised if they have not already done so to contact the registration department to sign all necessary forms in order for us  to release information regarding their care.   Consent: Patient/Guardian gives verbal consent for treatment and assignment of benefits for services provided during this visit. Patient/Guardian expressed understanding and agreed to proceed.   This note was generated in part or whole with voice recognition software. Voice recognition is usually quite accurate but there are transcription errors that can and very often do occur. I apologize for any typographical errors that were not detected and corrected.    Nicklas Mcsweeney, MD 04/02/2024, 12:04 PM

## 2024-04-02 ENCOUNTER — Encounter: Payer: Self-pay | Admitting: Psychiatry

## 2024-04-02 ENCOUNTER — Ambulatory Visit: Admitting: Licensed Clinical Social Worker

## 2024-04-05 ENCOUNTER — Ambulatory Visit (INDEPENDENT_AMBULATORY_CARE_PROVIDER_SITE_OTHER): Admitting: Licensed Clinical Social Worker

## 2024-04-05 DIAGNOSIS — F4310 Post-traumatic stress disorder, unspecified: Secondary | ICD-10-CM

## 2024-04-05 DIAGNOSIS — F3342 Major depressive disorder, recurrent, in full remission: Secondary | ICD-10-CM | POA: Diagnosis not present

## 2024-04-05 DIAGNOSIS — F4010 Social phobia, unspecified: Secondary | ICD-10-CM

## 2024-04-05 DIAGNOSIS — Z8659 Personal history of other mental and behavioral disorders: Secondary | ICD-10-CM
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
## 2024-04-15 ENCOUNTER — Ambulatory Visit: Admitting: Licensed Clinical Social Worker

## 2024-04-15 DIAGNOSIS — F4310 Post-traumatic stress disorder, unspecified: Secondary | ICD-10-CM

## 2024-04-15 DIAGNOSIS — F1290 Cannabis use, unspecified, uncomplicated: Secondary | ICD-10-CM

## 2024-04-15 DIAGNOSIS — F4010 Social phobia, unspecified: Secondary | ICD-10-CM

## 2024-04-15 DIAGNOSIS — Z8659 Personal history of other mental and behavioral disorders: Secondary | ICD-10-CM | POA: Diagnosis not present

## 2024-04-15 DIAGNOSIS — F3342 Major depressive disorder, recurrent, in full remission: Secondary | ICD-10-CM

## 2024-04-15 NOTE — Progress Notes (Signed)
 THERAPIST PROGRESS NOTE  Virtual Visit via Video Note  I connected with Donna Wagner on 04/15/24 at  1:00 PM EDT by a video enabled telemedicine application and verified that I am speaking with the correct person using two identifiers.  Location: Patient: Address on file  Provider: [facility]   I discussed the limitations of evaluation and management by telemedicine and the availability of in person appointments. The patient expressed understanding and agreed to proceed.  I discussed the assessment and treatment plan with the patient. The patient was provided an opportunity to ask questions and all were answered. The patient agreed with the plan and demonstrated an understanding of the instructions.   The patient was advised to call back or seek an in-person evaluation if the symptoms worsen or if the condition fails to improve as anticipated.  I provided 55 minutes of non-face-to-face time during this encounter.   Donna KATHEE Husband, LCSW   Session Time: 1-1:55pm  Participation Level: Active  Behavioral Response: CasualAlertEuthymic  Type of Therapy: Individual Therapy  Treatment Goals addressed:  Active     Anxiety     LTG: Donna Wagner will score less than 5 on the Generalized Anxiety Disorder 7 Scale (GAD-7)  (Initial)     Start:  02/23/24    Expected End:  05/25/24         STG: Donna Wagner will reduce frequency of avoidant behaviors by 50% as evidenced by self-report in therapy sessions (Initial)     Start:  02/23/24    Expected End:  05/25/24         LTG: find ways to manage my anxiety by doing some things (e.g. driving for more than 15 minutes and going out alone) that make me slightly uncomfortable  (Initial)     Start:  02/23/24    Expected End:  05/25/24      04/05/24: Patient reports she has been driving alone about every other day. Shares her anxiety is a lot better. Identified the consistent exposure as a source of progress. Identifies she continues to feel she  needs support with going into places alone. [name] and patient worked to identify things she needs to feel safe going alone.       Work with patient individually to identify the major components of a recent episode of anxiety: physical symptoms, major thoughts and images, and major behaviors they experienced     Start:  02/23/24         Coping Skills      Start:  02/23/24       Will work with the pt using CBT/DBT techniques to help the pt verbalize an understanding of the cognitive, physiological, and behavioral components of anxiety and its treatment. This will be done by using worksheets, interactive activities, CBT/ABC thought logs, modeling, homework, role playing and journaling. Will work with pt to learn and implement coping skills that result in a reduction of anxiety and improve daily functioning per pt self-report 3 out of 5 documented sessions.         OP Depression     LTG: Reduce frequency, intensity, and duration of depression symptoms so that daily functioning is improved (Initial)     Start:  02/23/24    Expected End:  05/25/24         LTG: Increase coping skills to manage depression and improve ability to perform daily activities (Initial)     Start:  02/23/24    Expected End:  05/25/24  STG: Donna Wagner will identify cognitive patterns and beliefs that support depression (Initial)     Start:  02/23/24    Expected End:  05/25/24         Work with Donna Wagner to identify the major components of a recent episode of depression: physical symptoms, major thoughts and images, and major behaviors they experienced     Start:  02/23/24         Donna Wagner will identify 2 cognitive distortions they are currently using and write reframing statements to replace them     Start:  02/23/24         Coping Skills      Start:  02/23/24       Will work with the pt using CBT/DBT techniques to help the pt verbalize an understanding of the cognitive, physiological, and behavioral  components of depression and its treatment. This will be done by using worksheets, interactive activities, CBT/ABC thought logs, modeling, homework, role playing and journaling. Will work with pt to learn and implement coping skills that result in a reduction of depression and improve daily functioning per pt self-report 3 out of 5 documented sessions.          ProgressTowards Goals: Progressing  Interventions: Supportive, mindfulness, assertive communication, and ACT  Summary: Donna Wagner is a 18 y.o. female who presents with symptoms of trauma, anxiety, depression. Patient identifies symptoms to include reexperiencing (nightmares), irritability, intrusive thoughts, uncontrollable worry, negative self affect, low mood. Pt was oriented times 5. Pt was cooperative and engaged. Pt reports SI but denies intent or plan at this time. Denies HI/AVH.    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  She expressed pride in her ability to drive alone and mentioned that she rewarded herself for this accomplishment. She shared that her anxiety has been manageable and mentioned that she has been misreading social exchanges, mistakenly thinking someone was upset with her when they were not. The clinician and the patient explored alternative ways she could have responded in those situations. The clinician educated the patient on using I feel.SABRASABRAI need statements and addressed strategies to help the patient decrease impulsive responses to others.   Also explored the patient's unhealthy attachment to her ex-boyfriend. She acknowledged her dependency on him and expressed a desire to become more independent, indicating that she often seeks external validation from others because she struggles to provide that validation for herself when feeling overwhelmed.  The patient reported ongoing struggles with boredom, stating, the minute I have nothing  to do, I get depressed. However, she shared that she is able to reframe her negative thoughts when she feels bored. The clinician educated her on the H.A.L.T. strategy to enhance her self-care and ultimately improve her impulsiveness.  Additionally, the patient reported that she avoids being alone with her thoughts and feels the need to have background noise at all times. She expressed a desire to return to reading but finds it difficult with distractions around her. The clinician practiced a thought dumping exercise with her, and the patient engaged in a leaves-on-a-stream meditation for cognitive defusion. She reported that the thought dumping exercise was effective and committed to practicing it once a week for two minutes.  Suicidal/Homicidal: Nowithout intent/plan  Therapist Response: Clinician utilized active and supportive reflection to create a safe environment for patient to process recent life stressors. Clinician assessed for current symptoms, safety, stressors since last session.  Clinician utilized acceptance and commitment therapy  approaches to educate patient on cognitive defusion techniques to address intrusive thoughts during states of boredom.  Educated patient on strategies to enhance her self-care to improve her impulsive behaviors.  Reviewed ways in which patient can utilize assertive communication and healthily expressing her feelings within personal relationships.  Plan: Return again in 2 weeks.  Diagnosis: MDD (major depressive disorder), recurrent, in full remission (HCC)  PTSD (post-traumatic stress disorder)  Social anxiety disorder  Hx of bipolar disorder  Long term current use of cannabis   Collaboration of Care: AEB psychiatrist can access notes and cln. Will review psychiatrists' notes. Check in with the patient and will see LCSW per availability. Patient agreed with treatment recommendations.   Patient/Guardian was advised Release of Information must be  obtained prior to any record release in order to collaborate their care with an outside provider. Patient/Guardian was advised if they have not already done so to contact the registration department to sign all necessary forms in order for us  to release information regarding their care.   Consent: Patient/Guardian gives verbal consent for treatment and assignment of benefits for services provided during this visit. Patient/Guardian expressed understanding and agreed to proceed.   Donna KATHEE Husband, LCSW 04/15/2024

## 2024-04-22 ENCOUNTER — Telehealth: Admitting: Psychiatry

## 2024-04-22 ENCOUNTER — Encounter: Payer: Self-pay | Admitting: Psychiatry

## 2024-04-22 DIAGNOSIS — F3342 Major depressive disorder, recurrent, in full remission: Secondary | ICD-10-CM

## 2024-04-22 DIAGNOSIS — F4310 Post-traumatic stress disorder, unspecified: Secondary | ICD-10-CM | POA: Diagnosis not present

## 2024-04-22 DIAGNOSIS — Z8659 Personal history of other mental and behavioral disorders: Secondary | ICD-10-CM

## 2024-04-22 DIAGNOSIS — F4010 Social phobia, unspecified: Secondary | ICD-10-CM | POA: Diagnosis not present

## 2024-04-22 DIAGNOSIS — F1290 Cannabis use, unspecified, uncomplicated: Secondary | ICD-10-CM | POA: Diagnosis not present

## 2024-04-22 MED ORDER — LAMOTRIGINE 25 MG PO TABS: 75.0000 mg | ORAL_TABLET | ORAL | 1 refills | Status: DC

## 2024-04-22 MED ORDER — GABAPENTIN 100 MG PO CAPS: 100.0000 mg | ORAL_CAPSULE | Freq: Every day | ORAL | Status: DC

## 2024-04-22 NOTE — Progress Notes (Signed)
 Virtual Visit via Video Note  I connected with Donna Wagner on 04/22/24 at  1:20 PM EDT by a video enabled telemedicine application and verified that I am speaking with the correct person using two identifiers.  Location Provider Location : [facility] Patient Location : Home  Participants: Patient , Provider   I discussed the limitations of evaluation and management by telemedicine and the availability of in person appointments. The patient expressed understanding and agreed to proceed.   I discussed the assessment and treatment plan with the patient. The patient was provided an opportunity to ask questions and all were answered. The patient agreed with the plan and demonstrated an understanding of the instructions.   The patient was advised to call back or seek an in-person evaluation if the symptoms worsen or if the condition fails to improve as anticipated.   BH MD OP Progress Note  04/22/2024 2:54 PM AISLING EMIGH  MRN:  [id number]  Chief Complaint:  Chief Complaint  Patient presents with   Follow-up   Anxiety   Depression   Medication Refill   Discussed the use of AI scribe software for clinical note transcription with the patient, who gave verbal consent to proceed.  History of Present Illness Donna Wagner is a 18 year old Caucasian female, single, lives in [place], has a history of MDD, PTSD, comorbid use of cannabis, currently employed, was evaluated by telemedicine today.  She has been managing her anxiety independently and has not experienced significant mood swings or negative symptoms recently. Her current medications include Lexapro  20 mg, Lamictal  50 mg daily, Abilify  5 mg, and gabapentin  100 mg twice a day. Gabapentin  was started approximately four months ago, possibly for anxiety, and Lamictal  was also added.  She was previously on Depakote  in January and participated in a Partial Hospitalization Program where her medications were frequently  adjusted.  She has no recent thoughts of self-harm or suicidal ideation. No nightmares, trauma-related symptoms, hallucinations, paranoia, or thoughts of harming others.  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 She is not currently in school but plans to return eventually.   She is keeping a log of her mood changes and menstrual cycle.  She takes her medications at 8 PM, which is around her bedtime, and does not think Lamictal  makes her drowsy, but she takes it at night just in case. She does not use trazodone  much and does not require a refill at this time.  She denies any other concerns today.    Visit Diagnosis:    ICD-10-CM   1. Recurrent major depressive disorder, in full remission (HCC)  F33.42 gabapentin  (NEURONTIN ) 100 MG capsule    lamoTRIgine  (LAMICTAL ) 25 MG tablet    2. PTSD (post-traumatic stress disorder)  F43.10     3. Social anxiety disorder  F40.10     4. Hx of bipolar disorder  Z86.59     5. Long term current use of cannabis  F12.90       Past Psychiatric History: I have reviewed past psychiatric history from progress note on 10/28/2023.  Past trials of medications like Zoloft , Depakote .  Past Medical History:  Past Medical History:  Diagnosis Date   Anxiety    Bipolar disorder [facility])    Depression     Past Surgical History:  Procedure Laterality Date   APPENDECTOMY     XI ROBOTIC LAPAROSCOPIC ASSISTED APPENDECTOMY N/A 11/19/2022  Procedure: XI ROBOTIC LAPAROSCOPIC ASSISTED APPENDECTOMY;  Surgeon: Rodolph Romano, MD;  Location: [facility] ORS;  Service: General;  Laterality: N/A;    Family Psychiatric History: I have reviewed family psychiatric history from progress note on 10/28/2023.  Family History:  Family History  Problem Relation Age of Onset   ADD / ADHD Mother    Hypertension Mother    Depression Mother    Mental illness Mother    Mental illness  Father    Mental illness Sister    Depression Sister    Cancer Maternal Grandfather    Mental illness Maternal Grandmother    Depression Maternal Grandmother     Social History: I have reviewed social history from progress note on 10/28/2023. Social History   Socioeconomic History   Marital status: Single    Spouse name: Not on file   Number of children: 0   Years of education: Not on file   Highest education level: High school graduate  Occupational History   Not on file  Tobacco Use   Smoking status: Never    Passive exposure: Never   Smokeless tobacco: Never  Vaping Use   Vaping status: Every Day   Substances: THC  Substance and Sexual Activity   Alcohol use: Not Currently   Drug use: Yes    Frequency: 7.0 times per week    Types: Marijuana    Comment: smokes every other day   Sexual activity: Yes    Partners: Male    Birth control/protection: Condom  Other Topics Concern   Not on file  Social History Narrative   Not on file   Social Drivers of Health   Financial Resource Strain: Low Risk  (02/03/2024)   Received from [facility]   Overall Financial Resource Strain (CARDIA)    Difficulty of Paying Living Expenses: Not hard at all  Food Insecurity: No Food Insecurity (02/03/2024)   Received from [facility]   Hunger Vital Sign    Within the past 12 months, you worried that your food would run out before you got the money to buy more.: Never true    Within the past 12 months, the food you bought just didn't last and you didn't have money to get more.: Never true  Transportation Needs: No Transportation Needs (02/03/2024)   Received from [facility] - Transportation    In the past 12 months, has lack of transportation kept you from medical appointments or from getting medications?: No    Lack of Transportation (Non-Medical): No  Physical Activity: Not on file  Stress: Not on file  Social Connections:  Not on file    Allergies:  Allergies  Allergen Reactions   Zoloft  [Sertraline ] Other (See Comments)    Does not work for pt    Metabolic Disorder Labs: Lab Results  Component Value Date   HGBA1C 5.2 08/07/2023   MPG 102.54 08/07/2023   MPG 96.8 12/20/2020   Lab Results  Component Value Date   PROLACTIN 29.1 (H) 12/20/2020   Lab Results  Component Value Date   CHOL 142 05/22/2023   TRIG 43.0 05/22/2023   HDL 45.30 05/22/2023   CHOLHDL 3 05/22/2023   VLDL 8.6 05/22/2023   LDLCALC 88 05/22/2023   LDLCALC 82 12/20/2020   Lab Results  Component Value Date   TSH 1.90 05/22/2023   TSH 1.185 12/20/2020    Therapeutic Level Labs: No results found for: LITHIUM Lab Results  Component Value  Date   VALPROATE 67 08/08/2023   No results found for: CBMZ  Current Medications: Current Outpatient Medications  Medication Sig Dispense Refill   ARIPiprazole  (ABILIFY ) 5 MG tablet TAKE 1 TABLET(5 MG) BY MOUTH DAILY 30 tablet 2   escitalopram  (LEXAPRO ) 20 MG tablet TAKE 1 TABLET(20 MG) BY MOUTH DAILY 90 tablet 0   gabapentin  (NEURONTIN ) 100 MG capsule Take 1 capsule (100 mg total) by mouth at bedtime.     lamoTRIgine  (LAMICTAL ) 25 MG tablet Take 3 tablets (75 mg total) by mouth as directed. Take 25 mg daily morning and 50 mg daily at bedtime. 90 tablet 1   melatonin 5 MG TABS Take 1 tablet (5 mg total) by mouth at bedtime as needed. 30 tablet 0   Multiple Vitamins-Iron  (MULTIVITAMINS WITH IRON ) TABS tablet Take 1 tablet by mouth daily. 30 tablet 0   norethindrone-ethinyl estradiol -FE (LOESTRIN FE) 1-20 MG-MCG tablet Take 1 tablet by mouth daily.     traZODone  (DESYREL ) 50 MG tablet Take 0.5-1 tablets (25-50 mg total) by mouth at bedtime as needed for sleep. 30 tablet 0   Vitamin D , Ergocalciferol , (DRISDOL ) 1.25 MG (50000 UNIT) CAPS capsule Take 1 capsule (50,000 Units total) by mouth every 7 (seven) days. 5 capsule 0   No current facility-administered medications for this visit.      Musculoskeletal: Strength & Muscle Tone: UTA Gait & Station: Seated Patient leans: N/A  Psychiatric Specialty Exam: Review of Systems  Psychiatric/Behavioral: Negative.      There were no vitals taken for this visit.There is no height or weight on file to calculate BMI.  General Appearance: Casual  Eye Contact:  Fair  Speech:  Clear and Coherent  Volume:  Normal  Mood:  Euthymic  Affect:  Congruent  Thought Process:  Goal Directed and Descriptions of Associations: Intact  Orientation:  Full (Time, Place, and Person)  Thought Content: Logical   Suicidal Thoughts:  No  Homicidal Thoughts:  No  Memory:  Immediate;   Fair Recent;   Fair Remote;   Fair  Judgement:  Fair  Insight:  Fair  Psychomotor Activity:  Normal  Concentration:  Concentration: Fair and Attention Span: Fair  Recall:  [employer] of Knowledge: Fair  Language: Fair  Akathisia:  No  Handed:  Right  AIMS (if indicated): Denies any side effects like tremors, abnormal involuntary movements  Assets:  Communication Skills Desire for Improvement Housing Talents/Skills Transportation  ADL's:  Intact  Cognition: WNL  Sleep:  Fair   Screenings: AIMS    Flowsheet Row Office Visit from 10/28/2023 in [facility] (Discharged[facility]  AIMS Total Score 0 0   AUDIT    Flowsheet Row Admission (Discharged) [facility]  Alcohol Use Disorder Identification Test Final Score (AUDIT) 0   GAD-7    Flowsheet Row Office Visit from 03/02/2024 in [facility] Counselor from 02/23/2024 in [facility] Office Visit from 10/28/2023 in [facility] Office Visit from 08/22/2023 in [facility] [employer] Visit from  05/15/2023 in [facility]  Total GAD-7 Score 2 4 17 19 11    PHQ2-9    Flowsheet Row Video Visit from 04/22/2024 in [facility] Office Visit from 03/02/2024 in [facility] Counselor from  02/23/2024 in [facility] Counselor from 12/31/2023 in [facility] Counselor from 12/15/2023 in [facility]  PHQ-2 Total Score 0 1 1 3 3   PHQ-9 Total Score -- -- 7 10 14    Flowsheet Row Video Visit from 04/22/2024 in [facility] Video Visit from 04/01/2024 in [facility] Office Visit from 03/02/2024 in [facility]  C-SSRS RISK CATEGORY Moderate Risk Moderate Risk Moderate Risk     Assessment and Plan: Arbor A Cervone is a 18 year old Caucasian female who has a history of depression, PTSD, anxiety was evaluated by telemedicine today.  Discussed assessment and plan as noted below.  MDD in remission Currently reports depression symptoms as well managed on the current medication regimen.  Patient on polypharmacy and is agreeable to being tapered off of gabapentin .  Since gabapentin  is being weaned off will readjust the dosage of Lamictal  to maintain mood stability. Continue Abilify  5 mg daily Continue Lexapro  20 mg daily Increase Lamotrigine  to 75 mg daily in divided dosage. Reduce Gabapentin  to 100 mg daily.  Posttraumatic stress disorder-stable Denies any significant flashbacks or nightmares or other trauma related symptoms. Continue Lexapro  20 mg daily Continue Trazodone  25 to 50 mg at bedtime as needed.  Social anxiety disorder-improving Currently managing anxiety symptoms on the current medication regimen. Current use psychotherapy sessions with Ms.  Perkins Continue Lexapro  as prescribed  Long-term use of cannabis-improving Denies use at this time. Will reevaluate in future sessions.  Will consider repeating lipid panel, prolactin level, hemoglobin A1c.  She has upcoming appointment with primary care.   Follow-up Follow-up in clinic in 1 month or sooner if needed.  Collaboration of Care: Collaboration of Care: Referral or follow-up with counselor/therapist AEB encouraged to continue psychotherapy sessions.  Patient/Guardian was advised Release of Information must be obtained prior to any record release in order to collaborate their care with an outside provider. Patient/Guardian was advised if they have not already done so to contact the registration department to sign all necessary forms in order for us  to release information regarding their care.   Consent: Patient/Guardian gives verbal consent for treatment and assignment of benefits for services provided during this visit. Patient/Guardian expressed understanding and agreed to proceed.   This note was generated in part or whole with voice recognition software. Voice recognition is usually quite accurate but there are transcription errors that can and very often do occur. I apologize for any typographical errors that were not detected and corrected.    Govani Radloff, MD 04/22/2024, 2:54 PM

## 2024-04-26 ENCOUNTER — Ambulatory Visit (INDEPENDENT_AMBULATORY_CARE_PROVIDER_SITE_OTHER): Admitting: Licensed Clinical Social Worker

## 2024-04-26 DIAGNOSIS — F4310 Post-traumatic stress disorder, unspecified: Secondary | ICD-10-CM | POA: Diagnosis not present

## 2024-04-26 DIAGNOSIS — Z8659 Personal history of other mental and behavioral disorders: Secondary | ICD-10-CM | POA: Diagnosis not present

## 2024-04-26 DIAGNOSIS — F3342 Major depressive disorder, recurrent, in full remission: Secondary | ICD-10-CM | POA: Diagnosis not present

## 2024-04-26 DIAGNOSIS — F4010 Social phobia, unspecified: Secondary | ICD-10-CM | POA: Diagnosis not present

## 2024-04-26 DIAGNOSIS — F1290 Cannabis use, unspecified, uncomplicated: Secondary | ICD-10-CM

## 2024-04-26 NOTE — Progress Notes (Signed)
 THERAPIST PROGRESS NOTE  Virtual Visit via Video Note  I connected with Fajr A Sane on 04/26/24 at 11:01 am by a video enabled telemedicine application and verified that I am speaking with the correct person using two identifiers.  Location: Patient: Address on file  Provider: [facility]   I discussed the limitations of evaluation and management by telemedicine and the availability of in person appointments. The patient expressed understanding and agreed to proceed.   I discussed the assessment and treatment plan with the patient. The patient was provided an opportunity to ask questions and all were answered. The patient agreed with the plan and demonstrated an understanding of the instructions.   The patient was advised to call back or seek an in-person evaluation if the symptoms worsen or if the condition fails to improve as anticipated.  I provided 16 minutes of non-face-to-face time during this encounter.   Evalene KATHEE Husband, LCSW   Session Time: 11:01am-11:17am  Participation Level: Active  Behavioral Response: CasualAlertEuthymic  Type of Therapy: Individual Therapy  Treatment Goals addressed:  Active     Anxiety     LTG: Xochitl will score less than 5 on the Generalized Anxiety Disorder 7 Scale (GAD-7)  (Initial)     Start:  02/23/24    Expected End:  05/25/24         STG: Mireyah will reduce frequency of avoidant behaviors by 50% as evidenced by self-report in therapy sessions (Initial)     Start:  02/23/24    Expected End:  05/25/24         LTG: find ways to manage my anxiety by doing some things (e.g. driving for more than 15 minutes and going out alone) that make me slightly uncomfortable  (Initial)     Start:  02/23/24    Expected End:  05/25/24      04/05/24: Patient reports she has been driving alone about every other day. Shares her anxiety is a lot better. Identified the consistent exposure as a source of progress. Identifies she continues to feel  she needs support with going into places alone. [name] and patient worked to identify things she needs to feel safe going alone.       Work with patient individually to identify the major components of a recent episode of anxiety: physical symptoms, major thoughts and images, and major behaviors they experienced     Start:  02/23/24         Coping Skills      Start:  02/23/24       Will work with the pt using CBT/DBT techniques to help the pt verbalize an understanding of the cognitive, physiological, and behavioral components of anxiety and its treatment. This will be done by using worksheets, interactive activities, CBT/ABC thought logs, modeling, homework, role playing and journaling. Will work with pt to learn and implement coping skills that result in a reduction of anxiety and improve daily functioning per pt self-report 3 out of 5 documented sessions.         OP Depression     LTG: Reduce frequency, intensity, and duration of depression symptoms so that daily functioning is improved (Initial)     Start:  02/23/24    Expected End:  05/25/24         LTG: Increase coping skills to manage depression and improve ability to perform daily activities (Initial)     Start:  02/23/24    Expected End:  05/25/24  STG: Jamariya will identify cognitive patterns and beliefs that support depression (Initial)     Start:  02/23/24    Expected End:  05/25/24         Work with Matraca to identify the major components of a recent episode of depression: physical symptoms, major thoughts and images, and major behaviors they experienced     Start:  02/23/24         Gizella will identify 2 cognitive distortions they are currently using and write reframing statements to replace them     Start:  02/23/24         Coping Skills      Start:  02/23/24       Will work with the pt using CBT/DBT techniques to help the pt verbalize an understanding of the cognitive, physiological, and behavioral  components of depression and its treatment. This will be done by using worksheets, interactive activities, CBT/ABC thought logs, modeling, homework, role playing and journaling. Will work with pt to learn and implement coping skills that result in a reduction of depression and improve daily functioning per pt self-report 3 out of 5 documented sessions.          ProgressTowards Goals: Progressing  Interventions: Solution Focused and Supportive  Summary: Lakina A Hebner is a 18 y.o. female who presents with symptoms of trauma, anxiety, depression. Patient identifies symptoms to include reexperiencing (nightmares), irritability, intrusive thoughts, uncontrollable worry, negative self affect, low mood. Pt was oriented times 5. Pt was cooperative and engaged. Pt reports SI but denies intent or plan at this time. Denies HI/AVH.    The patient reports that her anxiety has been non-existent, but she feels her depression is creeping up due to her room becoming dirty. She mentioned that while she feels she has the time to clean, she often gets too tired and chooses to sleep instead. During the session, we reflected on her current routine. The patient identified that she is currently working three jobs, totaling over 12 hours a day, five days a week.   Together, we established ways for her to delegate some work assignments to lighten her workload. We also set small goals to help her keep her room clean and organized. She decided to do her laundry in small batches on Mondays and Wednesdays. [name] provided brief psychoeducation on the importance of separating work from her personal space, especially since she works from her bedroom.  The patient acknowledged some barriers to maintaining her space and collaborated to identify possible solutions, such as cleaning her area as a way to unwind before going to bed.  Unfortunately, the patient had to end the session early due to food poisoning and not feeling  well. She was reminded her of her next session and offered to add her to the call list in case I have any openings before then.  Suicidal/Homicidal: Nowithout intent/plan  Therapist Response: Clinician utilized active and supportive reflection to create a safe space for patient to process recent life events.  Clinician assessed for current symptoms, stressors, safety since last session.  Clinician worked with patient on establishing solutions regarding feeling overwhelmed as a result of overworking herself and as a result neglecting certain tasks around the home which she identifies leads to depressive episodes.  Plan: Return again in 2 weeks.  Diagnosis: Recurrent major depressive disorder, in full remission (HCC)  PTSD (post-traumatic stress disorder)  Social anxiety disorder  Hx of bipolar disorder  Long term current use of cannabis  Collaboration of Care: AEB psychiatrist can access notes and cln. Will review psychiatrists' notes. Check in with the patient and will see LCSW per availability. Patient agreed with treatment recommendations.   Patient/Guardian was advised Release of Information must be obtained prior to any record release in order to collaborate their care with an outside provider. Patient/Guardian was advised if they have not already done so to contact the registration department to sign all necessary forms in order for us  to release information regarding their care.   Consent: Patient/Guardian gives verbal consent for treatment and assignment of benefits for services provided during this visit. Patient/Guardian expressed understanding and agreed to proceed.   Evalene KATHEE Husband, LCSW 04/26/2024

## 2024-05-02 ENCOUNTER — Other Ambulatory Visit: Payer: Self-pay | Admitting: Nurse Practitioner

## 2024-05-06 NOTE — Telephone Encounter (Signed)
 Medication refilled. Please schedule pt for follow up.

## 2024-05-11 ENCOUNTER — Ambulatory Visit (INDEPENDENT_AMBULATORY_CARE_PROVIDER_SITE_OTHER): Admitting: Licensed Clinical Social Worker

## 2024-05-11 DIAGNOSIS — F4310 Post-traumatic stress disorder, unspecified: Secondary | ICD-10-CM | POA: Diagnosis not present

## 2024-05-11 DIAGNOSIS — F3342 Major depressive disorder, recurrent, in full remission: Secondary | ICD-10-CM

## 2024-05-11 DIAGNOSIS — F4010 Social phobia, unspecified: Secondary | ICD-10-CM | POA: Diagnosis not present

## 2024-05-11 DIAGNOSIS — F1290 Cannabis use, unspecified, uncomplicated: Secondary | ICD-10-CM

## 2024-05-11 DIAGNOSIS — Z8659 Personal history of other mental and behavioral disorders: Secondary | ICD-10-CM | POA: Diagnosis not present
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
## 2024-05-17 ENCOUNTER — Ambulatory Visit: Payer: Self-pay | Admitting: Internal Medicine

## 2024-05-21 ENCOUNTER — Other Ambulatory Visit: Payer: Self-pay | Admitting: Psychiatry

## 2024-05-21 DIAGNOSIS — F3342 Major depressive disorder, recurrent, in full remission: Secondary | ICD-10-CM

## 2024-05-28 ENCOUNTER — Ambulatory Visit (INDEPENDENT_AMBULATORY_CARE_PROVIDER_SITE_OTHER): Payer: Self-pay | Admitting: Licensed Clinical Social Worker

## 2024-05-28 DIAGNOSIS — Z91199 Patient's noncompliance with other medical treatment and regimen due to unspecified reason: Secondary | ICD-10-CM

## 2024-05-28 NOTE — Progress Notes (Signed)
  Clinician attempted session via telehealth, but Donna Wagner did not join despite cln resending the link. Per [employer], s/he will be charged a no-show fee.

## 2024-06-01 ENCOUNTER — Telehealth (INDEPENDENT_AMBULATORY_CARE_PROVIDER_SITE_OTHER): Admitting: Psychiatry

## 2024-06-01 ENCOUNTER — Other Ambulatory Visit: Payer: Self-pay | Admitting: Psychiatry

## 2024-06-01 DIAGNOSIS — F4310 Post-traumatic stress disorder, unspecified: Secondary | ICD-10-CM

## 2024-06-01 DIAGNOSIS — Z91199 Patient's noncompliance with other medical treatment and regimen due to unspecified reason: Secondary | ICD-10-CM

## 2024-06-01 DIAGNOSIS — F3341 Major depressive disorder, recurrent, in partial remission: Secondary | ICD-10-CM

## 2024-06-01 DIAGNOSIS — F4010 Social phobia, unspecified: Secondary | ICD-10-CM

## 2024-06-01 NOTE — Progress Notes (Signed)
 No response to call or text or video invite

## 2024-06-16 ENCOUNTER — Ambulatory Visit (INDEPENDENT_AMBULATORY_CARE_PROVIDER_SITE_OTHER): Admitting: Licensed Clinical Social Worker

## 2024-06-16 ENCOUNTER — Encounter: Payer: Self-pay | Admitting: Licensed Clinical Social Worker

## 2024-06-16 DIAGNOSIS — Z91199 Patient's noncompliance with other medical treatment and regimen due to unspecified reason: Secondary | ICD-10-CM

## 2024-06-16 NOTE — Progress Notes (Signed)
 Patient ID: Donna Wagner, female   DOB: 2006-03-07, 18 y.o.   MRN: [id number]   Clinician attempted session via telehealth, but Avagail A Maggart did not join.  Due to back-to-back no-shows and continued attendance noncompliance, patient will be dismissed from therapy.

## 2024-06-28 ENCOUNTER — Ambulatory Visit: Admitting: Licensed Clinical Social Worker

## 2024-07-02 ENCOUNTER — Other Ambulatory Visit: Payer: Self-pay | Admitting: Psychiatry

## 2024-07-02 DIAGNOSIS — F4310 Post-traumatic stress disorder, unspecified: Secondary | ICD-10-CM

## 2024-07-02 DIAGNOSIS — F3341 Major depressive disorder, recurrent, in partial remission: Secondary | ICD-10-CM

## 2024-07-02 DIAGNOSIS — F4010 Social phobia, unspecified: Secondary | ICD-10-CM

## 2024-07-19 ENCOUNTER — Ambulatory Visit: Admitting: Licensed Clinical Social Worker

## 2024-07-28 ENCOUNTER — Ambulatory Visit: Payer: Self-pay | Admitting: Family

## 2024-07-28 ENCOUNTER — Encounter: Payer: Self-pay | Admitting: Family

## 2024-07-28 ENCOUNTER — Ambulatory Visit (INDEPENDENT_AMBULATORY_CARE_PROVIDER_SITE_OTHER): Admitting: Family

## 2024-07-28 VITALS — BP 110/70 | HR 93 | Temp 98.7°F | Ht 64.0 in | Wt 151.8 lb

## 2024-07-28 DIAGNOSIS — R051 Acute cough: Secondary | ICD-10-CM

## 2024-07-28 DIAGNOSIS — J029 Acute pharyngitis, unspecified: Secondary | ICD-10-CM | POA: Diagnosis not present

## 2024-07-28 DIAGNOSIS — R41840 Attention and concentration deficit: Secondary | ICD-10-CM

## 2024-07-28 DIAGNOSIS — R110 Nausea: Secondary | ICD-10-CM | POA: Diagnosis not present

## 2024-07-28 LAB — POCT RAPID STREP A (OFFICE): Rapid Strep A Screen: NEGATIVE

## 2024-07-28 LAB — POCT INFLUENZA A/B
Influenza A, POC: NEGATIVE
Influenza B, POC: NEGATIVE

## 2024-07-28 LAB — POC COVID19 BINAXNOW: SARS Coronavirus 2 Ag: NEGATIVE

## 2024-07-28 NOTE — Progress Notes (Unsigned)
   Assessment & Plan:  There are no diagnoses linked to this encounter.   Return precautions given.   Risks, benefits, and alternatives of the medications and treatment plan prescribed today were discussed, and patient expressed understanding.   Education regarding symptom management and diagnosis given to patient on AVS either electronically or printed.  No follow-ups on file.  Donna Northern, FNP  Subjective:    Patient ID: Donna Wagner, female    DOB: Jun 17, 2006, 18 y.o.   MRN: [id number]  CC: Donna Wagner is a 18 y.o. female who presents today for an acute visit.    HPI: Complains of bilious vomiting and loose brown stool;   Loose stool, non bloody 1 episode today. She felt hungry this morning. Staying hydrated.   Nausea resolved.  diarrhea x 4 days ago  She has episodic productive cough  No epigastric pain, fever, chills, sob, wheezing.   She doesn't feel tonsils are swollen.   No unusual foods, antibiotics, travel     Allergies: Zoloft  [sertraline ] Current Outpatient Medications on File Prior to Visit  Medication Sig Dispense Refill   ARIPiprazole  (ABILIFY ) 5 MG tablet TAKE 1 TABLET(5 MG) BY MOUTH DAILY 30 tablet 0   escitalopram  (LEXAPRO ) 20 MG tablet TAKE 1 TABLET(20 MG) BY MOUTH DAILY 90 tablet 0   gabapentin  (NEURONTIN ) 100 MG capsule Take 1 capsule (100 mg total) by mouth daily. Reduced dose 30 capsule 1   lamoTRIgine  (LAMICTAL ) 25 MG tablet Take 3 tablets (75 mg total) by mouth as directed. Take 25 mg daily morning and 50 mg daily at bedtime. 90 tablet 1   melatonin 5 MG TABS Take 1 tablet (5 mg total) by mouth at bedtime as needed. 30 tablet 0   Multiple Vitamins-Iron  (MULTIVITAMINS WITH IRON ) TABS tablet Take 1 tablet by mouth daily. 30 tablet 0   norethindrone-ethinyl estradiol -FE (LOESTRIN FE) 1-20 MG-MCG tablet Take 1 tablet by mouth daily.     traZODone  (DESYREL ) 50 MG tablet Take 0.5-1 tablets (25-50 mg total) by mouth at bedtime as needed  for sleep. 30 tablet 0   Vitamin D , Ergocalciferol , (DRISDOL ) 1.25 MG (50000 UNIT) CAPS capsule Take 1 capsule (50,000 Units total) by mouth every 7 (seven) days. 5 capsule 0   No current facility-administered medications on file prior to visit.    Review of Systems    Objective:    There were no vitals taken for this visit.  BP Readings from Last 3 Encounters:  09/24/23 105/65  08/22/23 90/70  08/04/23 106/75   Wt Readings from Last 3 Encounters:  09/24/23 128 lb (58.1 kg) (50%, Z= 0.00)*  08/22/23 119 lb 3.2 oz (54.1 kg) (33%, Z= -0.44)*  08/03/23 120 lb (54.4 kg) (35%, Z= -0.40)*   * Growth percentiles are based on CDC (Girls, 2-20 Years) data.    Physical Exam

## 2024-07-28 NOTE — Patient Instructions (Addendum)
 To whom it may concern,  Bergen A Helderman is given a work excuse note for 07/27/2024-07/29/2024. She will be able to return back to work regular as scheduled 07/30/2024.  Regards,  Arnette, Rollene MATSU NP

## 2024-07-30 ENCOUNTER — Other Ambulatory Visit: Payer: Self-pay | Admitting: Psychiatry

## 2024-07-30 ENCOUNTER — Encounter: Payer: Self-pay | Admitting: Family

## 2024-07-30 ENCOUNTER — Other Ambulatory Visit: Payer: Self-pay | Admitting: Nurse Practitioner

## 2024-07-30 DIAGNOSIS — F3342 Major depressive disorder, recurrent, in full remission: Secondary | ICD-10-CM

## 2024-07-30 DIAGNOSIS — R110 Nausea: Secondary | ICD-10-CM | POA: Insufficient documentation

## 2024-07-30 DIAGNOSIS — F4310 Post-traumatic stress disorder, unspecified: Secondary | ICD-10-CM

## 2024-07-30 DIAGNOSIS — F4010 Social phobia, unspecified: Secondary | ICD-10-CM

## 2024-07-30 DIAGNOSIS — F3341 Major depressive disorder, recurrent, in partial remission: Secondary | ICD-10-CM

## 2024-07-30 DIAGNOSIS — J029 Acute pharyngitis, unspecified: Secondary | ICD-10-CM | POA: Insufficient documentation

## 2024-07-30 NOTE — Assessment & Plan Note (Signed)
 Question if she had acute gastroenteritis, discussed likely viral. One episode of loose brown stool today.  Advised bland diet with broths, chicken noodle soup, toast, and saltine crackers. Encourage hydration with water and ginger ale. Monitor symptoms and report if symptoms worsen or diarrhea presents, she will let me know.

## 2024-07-30 NOTE — Assessment & Plan Note (Signed)
 Afebrile. Nontoxic in appearance. Reassuring HEENT exam. Neg strep test.  Symptoms are improving to resolved. Recommend Mucinex (guaifenesin) for congestion, salt water gargles, and lidocaine  sprays for throat discomfort. Encourage hydration and rest. Work note provided.

## 2024-08-02 ENCOUNTER — Encounter: Payer: Self-pay | Admitting: Psychiatry

## 2024-08-02 ENCOUNTER — Telehealth: Admitting: Psychiatry

## 2024-08-02 DIAGNOSIS — F3342 Major depressive disorder, recurrent, in full remission: Secondary | ICD-10-CM | POA: Diagnosis not present

## 2024-08-02 DIAGNOSIS — F4010 Social phobia, unspecified: Secondary | ICD-10-CM | POA: Diagnosis not present

## 2024-08-02 DIAGNOSIS — F4310 Post-traumatic stress disorder, unspecified: Secondary | ICD-10-CM | POA: Diagnosis not present

## 2024-08-02 DIAGNOSIS — F1290 Cannabis use, unspecified, uncomplicated: Secondary | ICD-10-CM

## 2024-08-02 DIAGNOSIS — Z8659 Personal history of other mental and behavioral disorders: Secondary | ICD-10-CM

## 2024-08-02 NOTE — Progress Notes (Unsigned)
 Virtual Visit via Video Note  I connected with Donna Wagner on 08/02/24 at  1:30 PM EDT by a video enabled telemedicine application and verified that I am speaking with the correct person using two identifiers.  Location Provider Location : [facility] Patient Location : Home  Participants: Patient , Provider   I discussed the limitations of evaluation and management by telemedicine and the availability of in person appointments. The patient expressed understanding and agreed to proceed.   I discussed the assessment and treatment plan with the patient. The patient was provided an opportunity to ask questions and all were answered. The patient agreed with the plan and demonstrated an understanding of the instructions.   The patient was advised to call back or seek an in-person evaluation if the symptoms worsen or if the condition fails to improve as anticipated.   BH MD OP Progress Note  08/02/2024 1:57 PM Donna Wagner  MRN:  [id number]  Chief Complaint:  Chief Complaint  Patient presents with   Follow-up   Depression   Anxiety   Medication Refill   Discussed the use of AI scribe software for clinical note transcription with the patient, who gave verbal consent to proceed.  History of Present Illness Donna Wagner is a 18 year old Caucasian female, single, lives in [place] with a history of MDD, PTSD, currently employed was evaluated by telemedicine today.  She reports doing well and describes her depression as remaining in remission. She denies any PTSD-related symptoms, such as intrusive memories, flashbacks, or nightmares, and she states she is sleeping very well. She reports no concerns with her current medications.  Her current regimen includes Abilify  5 mg, Lexapro  20 mg, Lamictal  75 mg and gabapentin  100 mg, which she notes she reduced at the last visit. She also states she does not use trazodone  and is sleeping well.  With a new therapist, Ms.Lily Hatchett (or  Thatchett) at [facility], she attends virtual sessions and finds them effective.  She denies any current thoughts of harming herself or others. She states the last time she experienced self-injurious behavior or thoughts of self-harm was approximately 4 to 5 months ago.  Currently employed at [employer], she works approximately 33 hours per week with a rotating schedule. She is not currently attending school. She lives at home and reports everything is good at home. She has a boyfriend and states the relationship is going very well.  Visit Diagnosis:    ICD-10-CM   1. Recurrent major depressive disorder, in full remission  F33.42     2. PTSD (post-traumatic stress disorder)  F43.10     3. Social anxiety disorder  F40.10     4. Hx of bipolar disorder  Z86.59     5. Long term current use of cannabis  F12.90       Past Psychiatric History: I have reviewed past psychiatric history from progress note on 10/28/2023.  Past trials of medications like Zoloft , Depakote , gabapentin .  Past Medical History:  Past Medical History:  Diagnosis Date   Anxiety    Bipolar disorder (HCC)    Depression     Past Surgical History:  Procedure Laterality Date   APPENDECTOMY     XI ROBOTIC LAPAROSCOPIC ASSISTED APPENDECTOMY N/A 11/19/2022   Procedure: XI ROBOTIC LAPAROSCOPIC ASSISTED APPENDECTOMY;  Surgeon: Rodolph Romano, MD;  Location: [facility] ORS;  Service: General;  Laterality: N/A;    Family Psychiatric History: Reviewed family psychiatric history from progress note on 10/28/2023.  Family History:  Family History  Problem Relation Age of Onset   ADD / ADHD Mother    Hypertension Mother    Depression Mother    Mental illness Mother    Mental illness Father    Mental illness Sister    Depression Sister    Cancer Maternal Grandfather    Mental illness Maternal Grandmother    Depression Maternal Grandmother     Social History: Reviewed social history from progress  note on 10/28/2023. Social History   Socioeconomic History   Marital status: Single    Spouse name: Not on file   Number of children: 0   Years of education: Not on file   Highest education level: High school graduate  Occupational History   Not on file  Tobacco Use   Smoking status: Never    Passive exposure: Never   Smokeless tobacco: Never  Vaping Use   Vaping status: Every Day   Substances: THC  Substance and Sexual Activity   Alcohol use: Not Currently   Drug use: Yes    Frequency: 7.0 times per week    Types: Marijuana    Comment: smokes every other day   Sexual activity: Yes    Partners: Male    Birth control/protection: Condom  Other Topics Concern   Not on file  Social History Narrative   Not on file   Social Drivers of Health   Financial Resource Strain: Low Risk  (02/03/2024)   [facility]   Overall Financial Resource Strain (CARDIA)    Difficulty of Paying Living Expenses: Not hard at all  Food Insecurity: No Food Insecurity (02/03/2024)   [facility] from [facility]   Hunger Vital Sign    Within the past 12 months, you worried that your food would run out before you got the money to buy more.: Never true    Within the past 12 months, the food you bought just didn't last and you didn't have money to get more.: Never true  Transportation Needs: No Transportation Needs (02/03/2024)   Received from [facility] - Transportation    In the past 12 months, has lack of transportation kept you from medical appointments or from getting medications?: No    Lack of Transportation (Non-Medical): No  Physical Activity: Not on file  Stress: Not on file  Social Connections: Not on file    Allergies:  Allergies  Allergen Reactions   Zoloft  [Sertraline ] Other (See Comments)    Does not work for pt    Metabolic Disorder Labs: Lab Results  Component Value Date   HGBA1C 5.2 08/07/2023   MPG  102.54 08/07/2023   MPG 96.8 12/20/2020   Lab Results  Component Value Date   PROLACTIN 29.1 (H) 12/20/2020   Lab Results  Component Value Date   CHOL 142 05/22/2023   TRIG 43.0 05/22/2023   HDL 45.30 05/22/2023   CHOLHDL 3 05/22/2023   VLDL 8.6 05/22/2023   LDLCALC 88 05/22/2023   LDLCALC 82 12/20/2020   Lab Results  Component Value Date   TSH 1.90 05/22/2023   TSH 1.185 12/20/2020    Therapeutic Level Labs: No results found for: LITHIUM Lab Results  Component Value Date   VALPROATE 67 08/08/2023   No results found for: CBMZ  Current Medications: Current Outpatient Medications  Medication Sig Dispense Refill   ARIPiprazole  (ABILIFY ) 5 MG tablet Take 1 tablet (5 mg total) by mouth daily. 90 tablet 1  escitalopram  (LEXAPRO ) 20 MG tablet TAKE 1 TABLET(20 MG) BY MOUTH DAILY 90 tablet 0   lamoTRIgine  (LAMICTAL ) 25 MG tablet TAKE 1 TABLET BY MOUTH EVERY MORNING AND 2 TABLETS EVERY NIGHT AT BEDTIME 90 tablet 0   melatonin 5 MG TABS Take 1 tablet (5 mg total) by mouth at bedtime as needed. 30 tablet 0   Multiple Vitamins-Iron  (MULTIVITAMINS WITH IRON ) TABS tablet Take 1 tablet by mouth daily. 30 tablet 0   norethindrone-ethinyl estradiol -FE (LOESTRIN FE) 1-20 MG-MCG tablet Take 1 tablet by mouth daily.     traZODone  (DESYREL ) 50 MG tablet Take 0.5-1 tablets (25-50 mg total) by mouth at bedtime as needed for sleep. 30 tablet 0   Vitamin D , Ergocalciferol , (DRISDOL ) 1.25 MG (50000 UNIT) CAPS capsule Take 1 capsule (50,000 Units total) by mouth every 7 (seven) days. 5 capsule 0   No current facility-administered medications for this visit.     Musculoskeletal: Strength & Muscle Tone: UTA Gait & Station: Seated Patient leans: N/A  Psychiatric Specialty Exam: Review of Systems  Psychiatric/Behavioral: Negative.      There were no vitals taken for this visit.There is no height or weight on file to calculate BMI.  General Appearance: Casual  Eye Contact:  Fair   Speech:  Clear and Coherent  Volume:  Normal  Mood:  Euthymic  Affect:  Congruent  Thought Process:  Goal Directed and Descriptions of Associations: Intact  Orientation:  Full (Time, Place, and Person)  Thought Content: Logical   Suicidal Thoughts:  No  Homicidal Thoughts:  No  Memory:  Immediate;   Fair Recent;   Fair Remote;   Fair  Judgement:  Fair  Insight:  Fair  Psychomotor Activity:  Normal  Concentration:  Concentration: Fair and Attention Span: Fair  Recall:  [employer] of Knowledge: Fair  Language: Fair  Akathisia:  No  Handed:  Right  AIMS (if indicated): not done  Assets:  Communication Skills Desire for Improvement Housing Social Support  ADL's:  Intact  Cognition: WNL  Sleep:  Fair   Screenings: AIMS    Flowsheet Row Office Visit from 10/28/2023 in [facility] Ad[facility] (Discharged[facility]  AIMS Total Score 0 0   AUDIT    Flowsheet Row Admission (Discharged) [facility]  Alcohol Use Disorder Identification Test Final Score (AUDIT) 0   GAD-7    Flowsheet Row Office Visit from 03/02/2024 in [facility] Counselor from 02/23/2024 in [facility] Office Visit from 10/28/2023 in [facility] Office Visit from 08/22/2023 in [facility] Visit from 05/15/2023 in [facility]  Total GAD-7 Score 2 4 17 19 11    [employer]    Flowsheet Row Office Visit from 07/28/2024 in [facility] Video Visit from 04/22/2024 in [facility] Office Visit from 03/02/2024 in [facility] Counselor from 02/23/2024 in [facility] Counselor from 12/31/2023 in [facility]  PHQ-2 Total Score 0 0 1 1 3   PHQ-9 Total Score -- -- -- 7 10   Flowsheet Row Video Visit from 04/22/2024 in [facility] Video Visit from 04/01/2024 in [facility] Office Visit from  03/02/2024 in [facility]  C-SSRS RISK CATEGORY Moderate Risk Moderate Risk Moderate Risk     Assessment and Plan: Marice A Gellerman is a 18 year old Caucasian female who has a history of depression, PTSD, anxiety was evaluated by telemedicine today.  Discussed assessment and plan as noted below.  1. Recurrent major depressive disorder, in full remission Currently denies any significant depression symptoms Continue Abilify  5 mg daily Continue Lexapro  20 mg daily Continue Lamictal  75 mg daily in divided dosage Discussed to taper off Gabapentin  by taking 100 mg every other day for the next 4 doses and stop taking it.  2. PTSD (post-traumatic stress disorder)-stable Denies any trauma related symptoms. Continue Lexapro  20 mg daily Continue psychotherapy sessions with Ms. Talbert Rav with [facility]  3. Social anxiety disorder-improving Reports anxiety symptoms as manageable Continue CBT Continue Lexapro  20 mg daily  4. Hx of bipolar disorder Currently denies symptoms  5. Long term current use of cannabis Currently denies use.  Will benefit from repeating TSH, lipid panel, hemoglobin A1c, prolactin level.  Patient agrees to get this done with primary care provider  Follow-up Follow-up in clinic in 2 months or sooner in person.    Collaboration of Care: Collaboration of Care: Referral or follow-up with counselor/therapist AEB encouraged to follow-up with therapist.  Patient/Guardian was advised Release of Information must be obtained prior to any record  release in order to collaborate their care with an outside provider. Patient/Guardian was advised if they have not already done so to contact the registration department to sign all necessary forms in order for us  to release information regarding their care.   Consent: Patient/Guardian gives verbal consent for treatment and assignment of benefits for services provided during this visit. Patient/Guardian expressed understanding and agreed to proceed.   This note was generated in part or whole with voice recognition software. Voice recognition is usually quite accurate but there are transcription errors that can and very often do occur. I apologize for any typographical errors that were not detected and corrected.    Tyreke Kaeser, MD 08/02/2024, 1:57 PM

## 2024-08-03 NOTE — Telephone Encounter (Signed)
 Left message to call the office back at [phone] to schedule a follow up appointment with Chelsea Aurora.

## 2024-10-05 ENCOUNTER — Ambulatory Visit: Admitting: Psychiatry

## 2024-10-11 ENCOUNTER — Encounter: Payer: Self-pay | Admitting: Psychiatry

## 2024-10-11 ENCOUNTER — Telehealth: Payer: Self-pay | Admitting: Psychiatry

## 2024-10-11 DIAGNOSIS — F3342 Major depressive disorder, recurrent, in full remission: Secondary | ICD-10-CM

## 2024-10-11 MED ORDER — ESCITALOPRAM OXALATE 20 MG PO TABS: 20.0000 mg | ORAL_TABLET | Freq: Every day | ORAL | 0 refills | Status: AC

## 2024-10-11 NOTE — Telephone Encounter (Signed)
 I have sent 30-day supply of Lexapro .  Lamictal  ran out according to our record more than a month ago.  Unknown if compliant on this medication.  Patient to contact the office if she needs a refill.  This was specified clearly in the letter that was printed out for patient with resources.

## 2024-11-22 ENCOUNTER — Other Ambulatory Visit: Payer: Self-pay | Admitting: Psychiatry

## 2024-11-22 DIAGNOSIS — F3342 Major depressive disorder, recurrent, in full remission: Secondary | ICD-10-CM

## 2024-11-24 ENCOUNTER — Other Ambulatory Visit: Payer: Self-pay | Admitting: Psychiatry

## 2024-11-24 DIAGNOSIS — F3342 Major depressive disorder, recurrent, in full remission: Secondary | ICD-10-CM
